# Patient Record
Sex: Male | Born: 1965 | Race: White | Hispanic: No | Marital: Married | Smoking: Never smoker
Health system: Southern US, Community
[De-identification: ages and names within clinical notes are randomized; demographics above are authoritative.]

## PROBLEM LIST (undated history)

## (undated) DIAGNOSIS — K449 Diaphragmatic hernia without obstruction or gangrene: Secondary | ICD-10-CM

## (undated) DIAGNOSIS — N529 Male erectile dysfunction, unspecified: Secondary | ICD-10-CM

## (undated) DIAGNOSIS — K602 Anal fissure, unspecified: Secondary | ICD-10-CM

## (undated) DIAGNOSIS — J189 Pneumonia, unspecified organism: Secondary | ICD-10-CM

## (undated) DIAGNOSIS — K5090 Crohn's disease, unspecified, without complications: Secondary | ICD-10-CM

## (undated) DIAGNOSIS — Z9289 Personal history of other medical treatment: Secondary | ICD-10-CM

## (undated) DIAGNOSIS — R0683 Snoring: Secondary | ICD-10-CM

## (undated) DIAGNOSIS — F419 Anxiety disorder, unspecified: Secondary | ICD-10-CM

## (undated) HISTORY — PX: TONSILLECTOMY: SUR1361

## (undated) HISTORY — PX: ANAL FISSURECTOMY: SUR608

## (undated) HISTORY — DX: Male erectile dysfunction, unspecified: N52.9

## (undated) HISTORY — DX: Crohn's disease, unspecified, without complications: K50.90

## (undated) HISTORY — DX: Snoring: R06.83

## (undated) HISTORY — DX: Anal fissure, unspecified: K60.2

## (undated) HISTORY — DX: Diaphragmatic hernia without obstruction or gangrene: K44.9

## (undated) HISTORY — DX: Anxiety disorder, unspecified: F41.9

## (undated) HISTORY — DX: Pneumonia, unspecified organism: J18.9

---

## 1990-08-18 HISTORY — PX: SMALL INTESTINE SURGERY: SHX150

## 2000-08-18 HISTORY — PX: INCISIONAL HERNIA REPAIR: SHX193

## 2013-10-07 ENCOUNTER — Ambulatory Visit: Payer: Self-pay | Admitting: Medical

## 2014-03-20 ENCOUNTER — Other Ambulatory Visit: Payer: Self-pay | Admitting: Urgent Care

## 2014-03-20 LAB — COMPREHENSIVE METABOLIC PANEL
ANION GAP: 5 — AB (ref 7–16)
AST: 28 U/L (ref 15–37)
Albumin: 3.6 g/dL (ref 3.4–5.0)
Alkaline Phosphatase: 71 U/L
BUN: 14 mg/dL (ref 7–18)
Bilirubin,Total: 0.6 mg/dL (ref 0.2–1.0)
CO2: 28 mmol/L (ref 21–32)
CREATININE: 1.06 mg/dL (ref 0.60–1.30)
Calcium, Total: 8.5 mg/dL (ref 8.5–10.1)
Chloride: 109 mmol/L — ABNORMAL HIGH (ref 98–107)
EGFR (African American): >60
Glucose: 88 mg/dL (ref 65–99)
Osmolality: 283 (ref 275–301)
POTASSIUM: 3.8 mmol/L (ref 3.5–5.1)
SGPT (ALT): 35 U/L
SODIUM: 142 mmol/L (ref 136–145)
Total Protein: 7.4 g/dL (ref 6.4–8.2)

## 2014-03-20 LAB — CBC WITH DIFFERENTIAL/PLATELET
BASOS ABS: 0.1 10*3/uL (ref 0.0–0.1)
BASOS PCT: 0.9 %
EOS ABS: 0.2 10*3/uL (ref 0.0–0.7)
Eosinophil %: 2.2 %
HCT: 41.7 % (ref 40.0–52.0)
HGB: 13.8 g/dL (ref 13.0–18.0)
Lymphocyte #: 2.2 10*3/uL (ref 1.0–3.6)
Lymphocyte %: 30.9 %
MCH: 30.7 pg (ref 26.0–34.0)
MCHC: 33 g/dL (ref 32.0–36.0)
MCV: 93 fL (ref 80–100)
MONO ABS: 0.6 x10 3/mm (ref 0.2–1.0)
MONOS PCT: 8.9 %
NEUTROS ABS: 4.1 10*3/uL (ref 1.4–6.5)
Neutrophil %: 57.1 %
PLATELETS: 191 10*3/uL (ref 150–440)
RBC: 4.48 10*6/uL (ref 4.40–5.90)
RDW: 12.9 % (ref 11.5–14.5)
WBC: 7.2 10*3/uL (ref 3.8–10.6)

## 2014-03-29 ENCOUNTER — Other Ambulatory Visit: Payer: Self-pay | Admitting: Radiology

## 2014-03-29 DIAGNOSIS — N63 Unspecified lump in unspecified breast: Secondary | ICD-10-CM

## 2014-04-07 ENCOUNTER — Ambulatory Visit
Admission: RE | Admit: 2014-04-07 | Discharge: 2014-04-07 | Disposition: A | Discharge status: Home / Self Care | Payer: Federal, State, Local not specified - PPO | Source: Ambulatory Visit | Attending: Radiology | Admitting: Radiology

## 2014-04-07 DIAGNOSIS — N63 Unspecified lump in unspecified breast: Secondary | ICD-10-CM

## 2014-04-07 MED ORDER — GADOBENATE DIMEGLUMINE 529 MG/ML IV SOLN
20.0000 mL | Freq: Once | INTRAVENOUS | Status: AC | PRN
  Administered 2014-04-07: 20 mL via INTRAVENOUS

## 2014-04-13 ENCOUNTER — Other Ambulatory Visit: Payer: Self-pay | Admitting: Surgery

## 2014-04-13 LAB — HCG, QUANTITATIVE, PREGNANCY

## 2014-08-09 ENCOUNTER — Ambulatory Visit: Payer: Self-pay | Admitting: Surgery

## 2014-08-09 LAB — BASIC METABOLIC PANEL
ANION GAP: 5 — AB (ref 7–16)
BUN: 19 mg/dL — ABNORMAL HIGH (ref 7–18)
CALCIUM: 8.4 mg/dL — AB (ref 8.5–10.1)
CREATININE: 1.02 mg/dL (ref 0.60–1.30)
Chloride: 109 mmol/L — ABNORMAL HIGH (ref 98–107)
Co2: 28 mmol/L (ref 21–32)
EGFR (African American): >60
GLUCOSE: 105 mg/dL — AB (ref 65–99)
Osmolality: 286 (ref 275–301)
POTASSIUM: 4.1 mmol/L (ref 3.5–5.1)
Sodium: 142 mmol/L (ref 136–145)

## 2014-08-18 HISTORY — PX: GYNECOMASTIA EXCISION: SHX5170

## 2014-12-09 NOTE — Op Note (Signed)
PATIENT NAME:  Willie Carney, Willie Carney MR#:  [id number] DATE OF BIRTH:  October 25, 1965  DATE OF PROCEDURE:  08/09/2014   PREOPERATIVE DIAGNOSIS: Left breast mass.   POSTOPERATIVE DIAGNOSIS:  Left breast mass, final pathology pending.   PROCEDURE PERFORMED: Left breast mass excision.   SURGEON: Natale Lay, M.D. FACS  ANESTHESIA: General with local 20 mL of 0.25% plain Marcaine.   FINDINGS: Large benign appearing well circumscribed mass in the left retroareolar and left upper outer quadrant.   SPECIMENS: As described above.   ESTIMATED BLOOD LOSS: Minimal.   DESCRIPTION OF PROCEDURE: With informed consent, supine position and general anesthesia with LMA the patient's left breast was sterilely prepped and draped with ChloraPrep solution and a timeout was observed.   An incision at the nipple areolar border was fashioned with scalpel and carried down with electrocautery into the breast tissue. Circumferential breast flaps were raised. Palpation of the area demonstrated a well-circumscribed 4 cm lesion which was excised in its entirety utilizing electrocautery. This extended into the left upper outer quadrant and slightly laterally as well. It was also retroareolar in nature. With the specimen handed off the field, the area was inspected for hemostasis and point hemostasis was obtained. The wound was then reapproximated in the deep layer with several interrupted 3-0 Vicryl sutures. Inverted 3-0 Vicryl deep dermal sutures were placed, followed by running 4-0 Monocryl. Steri-Strips, benzoin, fluffs, and occlusive compressive dressing was placed around the chest. The patient was subsequently extubated and taken to the recovery room in stable and satisfactory condition by anesthesia services.    ____________________________ Redge Gainer Egbert Garibaldi, MD mab:at D: 08/09/2014 08:38:37 ET T: 08/09/2014 11:46:00 ET JOB#: [id number]  cc: Loraine Leriche A. Egbert Garibaldi, MD, <Dictator> Raynald Kemp MD ELECTRONICALLY SIGNED 08/11/2014 19:49

## 2014-12-11 LAB — SURGICAL PATHOLOGY

## 2016-02-08 DIAGNOSIS — K5090 Crohn's disease, unspecified, without complications: Secondary | ICD-10-CM | POA: Diagnosis not present

## 2016-02-08 DIAGNOSIS — Z23 Encounter for immunization: Secondary | ICD-10-CM | POA: Diagnosis not present

## 2016-02-08 DIAGNOSIS — Z1389 Encounter for screening for other disorder: Secondary | ICD-10-CM | POA: Diagnosis not present

## 2016-02-08 DIAGNOSIS — R5383 Other fatigue: Secondary | ICD-10-CM | POA: Diagnosis not present

## 2016-02-08 DIAGNOSIS — R0683 Snoring: Secondary | ICD-10-CM | POA: Diagnosis not present

## 2016-02-08 DIAGNOSIS — K432 Incisional hernia without obstruction or gangrene: Secondary | ICD-10-CM | POA: Diagnosis not present

## 2016-02-29 DIAGNOSIS — K5090 Crohn's disease, unspecified, without complications: Secondary | ICD-10-CM | POA: Diagnosis not present

## 2016-02-29 DIAGNOSIS — K432 Incisional hernia without obstruction or gangrene: Secondary | ICD-10-CM | POA: Diagnosis not present

## 2016-03-08 ENCOUNTER — Other Ambulatory Visit: Payer: Self-pay | Admitting: General Surgery

## 2016-03-08 DIAGNOSIS — K432 Incisional hernia without obstruction or gangrene: Secondary | ICD-10-CM

## 2016-03-14 ENCOUNTER — Ambulatory Visit
Admission: RE | Admit: 2016-03-14 | Discharge: 2016-03-14 | Disposition: A | Discharge status: Home / Self Care | Payer: Federal, State, Local not specified - PPO | Source: Ambulatory Visit | Attending: General Surgery | Admitting: General Surgery

## 2016-03-14 DIAGNOSIS — K432 Incisional hernia without obstruction or gangrene: Secondary | ICD-10-CM | POA: Diagnosis not present

## 2016-03-14 MED ORDER — IOPAMIDOL (ISOVUE-300) INJECTION 61%
100.0000 mL | Freq: Once | INTRAVENOUS | Status: AC | PRN
  Administered 2016-03-14: 100 mL via INTRAVENOUS

## 2016-03-18 ENCOUNTER — Encounter: Payer: Self-pay | Admitting: Gastroenterology

## 2016-04-09 DIAGNOSIS — K080 Exfoliation of teeth due to systemic causes: Secondary | ICD-10-CM | POA: Diagnosis not present

## 2016-05-09 DIAGNOSIS — K080 Exfoliation of teeth due to systemic causes: Secondary | ICD-10-CM | POA: Diagnosis not present

## 2016-05-16 DIAGNOSIS — N62 Hypertrophy of breast: Secondary | ICD-10-CM | POA: Diagnosis not present

## 2016-05-16 DIAGNOSIS — K5090 Crohn's disease, unspecified, without complications: Secondary | ICD-10-CM | POA: Diagnosis not present

## 2016-05-16 DIAGNOSIS — Z125 Encounter for screening for malignant neoplasm of prostate: Secondary | ICD-10-CM | POA: Diagnosis not present

## 2016-05-16 DIAGNOSIS — Z0000 Encounter for general adult medical examination without abnormal findings: Secondary | ICD-10-CM | POA: Diagnosis not present

## 2016-05-23 ENCOUNTER — Encounter: Payer: Self-pay | Admitting: Gastroenterology

## 2016-05-23 ENCOUNTER — Ambulatory Visit (INDEPENDENT_AMBULATORY_CARE_PROVIDER_SITE_OTHER): Payer: Federal, State, Local not specified - PPO | Admitting: Gastroenterology

## 2016-05-23 ENCOUNTER — Encounter (INDEPENDENT_AMBULATORY_CARE_PROVIDER_SITE_OTHER): Payer: Self-pay

## 2016-05-23 VITALS — BP 110/78 | HR 72 | Ht 66.0 in | Wt 212.2 lb

## 2016-05-23 DIAGNOSIS — K5090 Crohn's disease, unspecified, without complications: Secondary | ICD-10-CM | POA: Diagnosis not present

## 2016-05-23 DIAGNOSIS — K439 Ventral hernia without obstruction or gangrene: Secondary | ICD-10-CM | POA: Diagnosis not present

## 2016-05-23 DIAGNOSIS — N528 Other male erectile dysfunction: Secondary | ICD-10-CM | POA: Diagnosis not present

## 2016-05-23 DIAGNOSIS — R197 Diarrhea, unspecified: Secondary | ICD-10-CM | POA: Diagnosis not present

## 2016-05-23 DIAGNOSIS — N62 Hypertrophy of breast: Secondary | ICD-10-CM | POA: Diagnosis not present

## 2016-05-23 DIAGNOSIS — Z1389 Encounter for screening for other disorder: Secondary | ICD-10-CM | POA: Diagnosis not present

## 2016-05-23 DIAGNOSIS — Z0000 Encounter for general adult medical examination without abnormal findings: Secondary | ICD-10-CM | POA: Diagnosis not present

## 2016-05-23 DIAGNOSIS — R5383 Other fatigue: Secondary | ICD-10-CM | POA: Diagnosis not present

## 2016-05-23 MED ORDER — NA SULFATE-K SULFATE-MG SULF 17.5-3.13-1.6 GM/177ML PO SOLN: 1.0000 | Freq: Once | ORAL | 0 refills | Status: AC

## 2016-05-23 NOTE — Patient Instructions (Signed)
If you are age 49 or older, your body mass index should be between 23-30. Your Body mass index is 34.26 kg/m. If this is out of the aforementioned range listed, please consider follow up with your Primary Care Provider.  If you are age 64 or younger, your body mass index should be between 19-25. Your Body mass index is 34.26 kg/m. If this is out of the aformentioned range listed, please consider follow up with your Primary Care Provider.   You have been scheduled for a colonoscopy. Please follow written instructions given to you at your visit today.  Please pick up your prep supplies at the pharmacy within the next 1-3 days. If you use inhalers (even only as needed), please bring them with you on the day of your procedure. Your physician has requested that you go to www.startemmi.com and enter the access code given to you at your visit today. This web site gives a general overview about your procedure. However, you should still follow specific instructions given to you by our office regarding your preparation for the procedure.  Thank you for choosing [facility]  Dr Henry Danis III  

## 2016-05-23 NOTE — Progress Notes (Signed)
[facility] Consult Note:  History: Willie Carney 05/23/2016  Referring physician: Donnajean Lopes, MD  Reason for consult/chief complaint: Hernia (getting ready to have hernia surgery) and Crohn's Disease   Subjective  HPI:  This is a 50 year old man referred by Dr. 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 No outside records are available from this patient's prior surgery, since it was done in 1992. He reports having had part of the right colon removed at the [facility] and was told that he had Crohn's disease. Not recall any further recommendations and he certainly did not follow with a gastroenterologist. Thus, he has never been on any medical therapy for Crohn's disease. He also describes surgery for what was either a fissure or a fistula on the right perianal area at [facility] with the last several years. He described an abdominal wall hernia after his initial surgery, and this was repaired with mesh. He then had recurrence of the hernia and is recently seen in general surgery to consider repair. They would like to know if he has active Crohn's disease before pursuing hernia surgery. He reports decades of bowel habits that alternate between formed to loose or hard stool with occasional need to strain but no rectal bleeding. He denies abdominal pain, his appetite is good and his weight stable.  ROS:  Review of Systems  Constitutional: Negative for appetite change and unexpected weight change.  HENT: Negative for mouth sores and voice change.   Eyes: Negative for pain and redness.  Respiratory: Negative for cough and shortness of breath.   Cardiovascular: Negative for chest pain and palpitations.  Genitourinary: Negative for dysuria and hematuria.  Musculoskeletal: Negative for arthralgias and myalgias.  Skin: Negative for pallor and rash.  Neurological: Negative for weakness and headaches.  Hematological: Negative  for adenopathy.     Past Medical History: Past Medical History:  Diagnosis Date  . Anal fissure   . Anxiety   . Crohn's disease ([place])   . ED (erectile dysfunction)   . Hiatal hernia   . Pneumonia   . Snorings      Past Surgical History: Past Surgical History:  Procedure Laterality Date  . ANAL FISSURECTOMY    . GYNECOMASTIA EXCISION  2016  . [place]  2002  . SMALL INTESTINE SURGERY  1992   took 9 cm     Family History: Family History  Problem Relation Age of Onset  . Lung cancer Mother   . COPD Mother   . Parkinson's disease Father   . Stroke Maternal Grandmother   . Cancer Maternal Uncle     type unknown  . Colon cancer Neg Hx     Social History: Social History   Social History  . Marital status: Married    Spouse name: N/A  . Number of children: 3  . Years of education: N/A   Occupational History  . [employer]    Social History Main Topics  . Smoking status: Never Smoker  . Smokeless tobacco: Never Used  . Alcohol use Yes     Comment: 3-5 per week  . Drug use: No  . Sexual activity: Yes    Partners: Female   Other Topics Concern  . None   Social History Narrative  . None    Allergies: No Known Allergies  Outpatient Meds: Current Outpatient Prescriptions  Medication Sig Dispense Refill  . Cyanocobalamin (VITAMIN B12 PO) Take 1 tablet by mouth daily.    Marland Kitchen  FIBER PO Take 1 tablet by mouth daily.    . Na Sulfate-K Sulfate-Mg Sulf 17.5-3.13-1.6 GM/180ML SOLN Take 1 kit by mouth once. 354 mL 0   No current facility-administered medications for this visit.       ___________________________________________________________________ Objective   Exam:  BP 110/78 (BP Location: Left Arm, Patient Position: Sitting, Cuff Size: Normal)   Pulse 72   Ht 5' 6"  (1.676 m) Comment: height measured without shoes  Wt 212 lb 4 oz (96.3 kg)   BMI 34.26 kg/m    General: this is a(n) well-appearing middle-aged man   Eyes:  sclera anicteric, no redness  ENT: oral mucosa moist without lesions, no cervical or supraclavicular lymphadenopathy, good dentition  CV: RRR without murmur, S1/S2, no JVD, no peripheral edema  Resp: clear to auscultation bilaterally, normal RR and effort noted  GI: soft, no tenderness, with active bowel sounds. No guarding or palpable organomegaly noted. Long midline scar with a spontaneously reducing ventral hernia  Skin; warm and dry, no rash or jaundice noted  Neuro: awake, alert and oriented x 3. Normal gross motor function and fluent speech Rectal exam: Palpable scar tissue at the 9:00 position in the perianal and anal canal, no tenderness, no drainage, no blood, no palpable internal lesions. His prostate is enlarged but soft and with no palpable nodules.  Labs:  Reports recent labs "normal" Radiologic Studies: See 7/28 CTAP report - no clear IBD  Assessment: Encounter Diagnoses  Name Primary?  . Diarrhea, unspecified type Yes  . Ventral hernia without obstruction or gangrene     His history is uncertain, he may very well have had surgery for ileocolonic Crohn's but no records are available. It is also unknown if he had a fissure or a possible IBD related fistula requiring surgery years ago. He does not appear to have an active fistula or fissure right now Plan:  Colonoscopy and further plan to follow.  Thank you for the courtesy of this consult.  Please call me with any questions or concerns.  Nelida Meuse III  CC: Donnajean Lopes, MD Dr. Greer Pickerel, [facility] surgery

## 2016-06-05 ENCOUNTER — Ambulatory Visit: Payer: Self-pay | Admitting: General Surgery

## 2016-06-25 ENCOUNTER — Encounter: Payer: Self-pay | Admitting: Gastroenterology

## 2016-07-04 ENCOUNTER — Encounter: Payer: Self-pay | Admitting: Gastroenterology

## 2016-07-04 ENCOUNTER — Ambulatory Visit (AMBULATORY_SURGERY_CENTER): Payer: Federal, State, Local not specified - PPO | Admitting: Gastroenterology

## 2016-07-04 VITALS — BP 113/78 | HR 76 | Temp 98.9°F | Resp 12 | Ht 66.0 in | Wt 212.0 lb

## 2016-07-04 DIAGNOSIS — R197 Diarrhea, unspecified: Secondary | ICD-10-CM | POA: Diagnosis not present

## 2016-07-04 MED ORDER — SODIUM CHLORIDE 0.9 % IV SOLN: 500.0000 mL | INTRAVENOUS | Status: AC

## 2016-07-04 NOTE — Progress Notes (Signed)
To recovery VSS report to RN 

## 2016-07-04 NOTE — Patient Instructions (Signed)
Impression/Recommendations:  Repeat colonoscopy in 10 years for screening purposes.  YOU HAD AN ENDOSCOPIC PROCEDURE TODAY AT THE [facility]:   Refer to the procedure report that was given to you for any specific questions about what was found during the examination.  If the procedure report does not answer your questions, please call your gastroenterologist to clarify.  If you requested that your care partner not be given the details of your procedure findings, then the procedure report has been included in a sealed envelope for you to review at your convenience later.  YOU SHOULD EXPECT: Some feelings of bloating in the abdomen. Passage of more gas than usual.  Walking can help get rid of the air that was put into your GI tract during the procedure and reduce the bloating. If you had a lower endoscopy (such as a colonoscopy or flexible sigmoidoscopy) you may notice spotting of blood in your stool or on the toilet paper. If you underwent a bowel prep for your procedure, you may not have a normal bowel movement for a few days.  Please Note:  You might notice some irritation and congestion in your nose or some drainage.  This is from the oxygen used during your procedure.  There is no need for concern and it should clear up in a day or so.  SYMPTOMS TO REPORT IMMEDIATELY:   Following lower endoscopy (colonoscopy or flexible sigmoidoscopy):  Excessive amounts of blood in the stool  Significant tenderness or worsening of abdominal pains  Swelling of the abdomen that is new, acute  Fever of 100F or higher For urgent or emergent issues, a gastroenterologist can be reached at any hour by calling [phone].   DIET:  We do recommend a small meal at first, but then you may proceed to your regular diet.  Drink plenty of fluids but you should avoid alcoholic beverages for 24 hours.  ACTIVITY:  You should plan to take it easy for the rest of today and you should NOT DRIVE or use heavy  machinery until tomorrow (because of the sedation medicines used during the test).    FOLLOW UP: Our staff will call the number listed on your records the next business day following your procedure to check on you and address any questions or concerns that you may have regarding the information given to you following your procedure. If we do not reach you, we will leave a message.  However, if you are feeling well and you are not experiencing any problems, there is no need to return our call.  We will assume that you have returned to your regular daily activities without incident.  If any biopsies were taken you will be contacted by phone or by letter within the next 1-3 weeks.  Please call us at [phone] if you have not heard about the biopsies in 3 weeks.    SIGNATURES/CONFIDENTIALITY: You and/or your care partner have signed paperwork which will be entered into your electronic medical record.  These signatures attest to the fact that that the information above on your After Visit Summary has been reviewed and is understood.  Full responsibility of the confidentiality of this discharge information lies with you and/or your care-partner.

## 2016-07-04 NOTE — Op Note (Signed)
[facility] Patient Name: Willie Carney Procedure Date: 07/04/2016 2:17 PM MRN: [id number] Endoscopist: Sherilyn Cooter L. Myrtie Neither , MD Age: 50 Referring MD:  Date of Birth: 1966/08/03 Gender: Male Account #: [id number] Procedure:                Colonoscopy Indications:              Chronic diarrhea, prior partial colectomy for                            reported Crohn's disease (no prior records for                            review). Medicines:                Monitored Anesthesia Care Procedure:                Pre-Anesthesia Assessment:                           - Prior to the procedure, a History and Physical                            was performed, and patient medications and                            allergies were reviewed. The patient's tolerance of                            previous anesthesia was also reviewed. The risks                            and benefits of the procedure and the sedation                            options and risks were discussed with the patient.                            All questions were answered, and informed consent                            was obtained. Prior Anticoagulants: The patient has                            taken no previous anticoagulant or antiplatelet                            agents. ASA Grade Assessment: II - A patient with                            mild systemic disease. After reviewing the risks                            and benefits, the patient was deemed in  satisfactory condition to undergo the procedure.                           After obtaining informed consent, the colonoscope                            was passed under direct vision. Throughout the                            procedure, the patient's blood pressure, pulse, and                            oxygen saturations were monitored continuously. The                            Model PCF-H190L [id number](SN#[id number]) scope was introduced                  through the anus and advanced to the the                            neo-terminal ileum. The colonoscopy was performed                            without difficulty. The patient tolerated the                            procedure well. The quality of the bowel                            preparation was excellent. The terminal ileum and                            the rectum were photographed. The bowel preparation                            used was SUPREP. Scope In: 2:23:01 PM Scope Out: 2:36:26 PM Scope Withdrawal Time: 0 hours 8 minutes 25 seconds  Total Procedure Duration: 0 hours 13 minutes 25 seconds  Findings:                 The perianal and digital rectal examinations were                            normal.                           The neo-terminal ileum appeared normal. No active                            Crohn's disease was seen.                           There was evidence of a prior end-to-side                            ileo-colonic  anastomosis in the mid ascending                            colon. This was patent and was characterized by                            healthy appearing mucosa. The entire colonic mucosa                            was normal.                           The exam was otherwise without abnormality on                            direct and retroflexion views. Complications:            No immediate complications. Estimated Blood Loss:     Estimated blood loss: none. Impression:               - The examined portion of the ileum was normal.                           - Patent end-to-side ileo-colonic anastomosis,                            characterized by healthy appearing mucosa.                           - The examination was otherwise normal on direct                            and retroflexion views.                           - No specimens collected. Recommendation:           - Patient has a contact number available for                             emergencies. The signs and symptoms of potential                            delayed complications were discussed with the                            patient. Return to normal activities tomorrow.                            Written discharge instructions were provided to the                            patient.                           - Resume previous diet.                           -  Continue present medications.                           - Repeat colonoscopy in 10 years for screening                            purposes. Daymon Hora L. Myrtie Neither, MD 07/04/2016 2:44:05 PM This report has been signed electronically. CC Letter to:             Gaynelle Adu, MD

## 2016-07-07 ENCOUNTER — Telehealth: Payer: Self-pay

## 2016-07-07 ENCOUNTER — Telehealth: Payer: Self-pay | Admitting: *Deleted

## 2016-07-07 NOTE — Telephone Encounter (Signed)
  Follow up Call-  Call back number 07/04/2016  Post procedure Call Back phone  # [phone]  Permission to leave phone message Yes  Some recent data might be hidden     Patient questions:  Do you have a fever, pain , or abdominal swelling? No. Pain Score  0 *  Have you tolerated food without any problems? Yes.    Have you been able to return to your normal activities? Yes.    Do you have any questions about your discharge instructions: Diet   No. Medications  No. Follow up visit  No.  Do you have questions or concerns about your Care? No.  Actions: * If pain score is 4 or above: No action needed, pain <4.

## 2016-07-07 NOTE — Telephone Encounter (Signed)
No answer. Name identifier. Message left we will attempt to call later today.

## 2016-07-31 DIAGNOSIS — K432 Incisional hernia without obstruction or gangrene: Secondary | ICD-10-CM | POA: Diagnosis not present

## 2016-08-05 ENCOUNTER — Encounter (INDEPENDENT_AMBULATORY_CARE_PROVIDER_SITE_OTHER): Payer: Self-pay

## 2016-08-05 ENCOUNTER — Encounter (HOSPITAL_COMMUNITY)
Admission: RE | Admit: 2016-08-05 | Discharge: 2016-08-05 | Disposition: A | Discharge status: Home / Self Care | Payer: Federal, State, Local not specified - PPO | Source: Ambulatory Visit | Attending: General Surgery | Admitting: General Surgery

## 2016-08-05 ENCOUNTER — Encounter (HOSPITAL_COMMUNITY): Payer: Self-pay

## 2016-08-05 DIAGNOSIS — F329 Major depressive disorder, single episode, unspecified: Secondary | ICD-10-CM | POA: Diagnosis not present

## 2016-08-05 DIAGNOSIS — K5090 Crohn's disease, unspecified, without complications: Secondary | ICD-10-CM | POA: Diagnosis not present

## 2016-08-05 DIAGNOSIS — F419 Anxiety disorder, unspecified: Secondary | ICD-10-CM | POA: Diagnosis not present

## 2016-08-05 DIAGNOSIS — K432 Incisional hernia without obstruction or gangrene: Secondary | ICD-10-CM | POA: Diagnosis not present

## 2016-08-05 DIAGNOSIS — Z01812 Encounter for preprocedural laboratory examination: Secondary | ICD-10-CM | POA: Insufficient documentation

## 2016-08-05 DIAGNOSIS — Z888 Allergy status to other drugs, medicaments and biological substances status: Secondary | ICD-10-CM | POA: Diagnosis not present

## 2016-08-05 HISTORY — DX: Personal history of other medical treatment: Z92.89

## 2016-08-05 LAB — COMPREHENSIVE METABOLIC PANEL
ALK PHOS: 46 U/L (ref 38–126)
ALT: 24 U/L (ref 17–63)
ANION GAP: 6 (ref 5–15)
AST: 24 U/L (ref 15–41)
Albumin: 4 g/dL (ref 3.5–5.0)
BILIRUBIN TOTAL: 0.9 mg/dL (ref 0.3–1.2)
BUN: 15 mg/dL (ref 6–20)
CALCIUM: 8.6 mg/dL — AB (ref 8.9–10.3)
CO2: 25 mmol/L (ref 22–32)
CREATININE: 0.86 mg/dL (ref 0.61–1.24)
Chloride: 108 mmol/L (ref 101–111)
GFR calc non Af Amer: >60 mL/min (ref >60)
GLUCOSE: 91 mg/dL (ref 65–99)
Potassium: 3.9 mmol/L (ref 3.5–5.1)
SODIUM: 139 mmol/L (ref 135–145)
TOTAL PROTEIN: 6.9 g/dL (ref 6.5–8.1)

## 2016-08-05 LAB — CBC WITH DIFFERENTIAL/PLATELET
Basophils Absolute: 0 10*3/uL (ref 0.0–0.1)
Basophils Relative: 0 %
EOS ABS: 0.3 10*3/uL (ref 0.0–0.7)
Eosinophils Relative: 5 %
HEMATOCRIT: 39.2 % (ref 39.0–52.0)
HEMOGLOBIN: 13.8 g/dL (ref 13.0–17.0)
LYMPHS ABS: 2.1 10*3/uL (ref 0.7–4.0)
LYMPHS PCT: 38 %
MCH: 31.2 pg (ref 26.0–34.0)
MCHC: 35.2 g/dL (ref 30.0–36.0)
MCV: 88.5 fL (ref 78.0–100.0)
MONOS PCT: 9 %
Monocytes Absolute: 0.5 10*3/uL (ref 0.1–1.0)
NEUTROS ABS: 2.7 10*3/uL (ref 1.7–7.7)
NEUTROS PCT: 48 %
Platelets: 167 10*3/uL (ref 150–400)
RBC: 4.43 MIL/uL (ref 4.22–5.81)
RDW: 11.9 % (ref 11.5–15.5)
WBC: 5.6 10*3/uL (ref 4.0–10.5)

## 2016-08-05 NOTE — Patient Instructions (Addendum)
Willie Carney  08/05/2016   Your procedure is scheduled on: 08-07-16  Report to [facility] Main  Entrance take [facility]  elevators to 3rd floor to  Short Stay Center at  0530  AM.  Call this number if you have problems the morning of surgery [phone]   Remember: ONLY 1 PERSON MAY GO WITH YOU TO SHORT STAY TO GET  READY MORNING OF YOUR SURGERY.  Do not eat food or drink liquids :After Midnight.     Take these medicines the morning of surgery with A SIP OF WATER: Eye drops-usual. DO NOT TAKE ANY DIABETIC MEDICATIONS DAY OF YOUR SURGERY                               You may not have any metal on your body including hair pins and              piercings  Do not wear jewelry, make-up, lotions, powders or perfumes, deodorant             Do not wear nail polish.  Do not shave  48 hours prior to surgery.              Men may shave face and neck.   Do not bring valuables to the hospital. [facility] IS NOT             RESPONSIBLE   FOR VALUABLES.  Contacts, dentures or bridgework may not be worn into surgery.  Leave suitcase in the car. After surgery it may be brought to your room.     Patients discharged the day of surgery will not be allowed to drive home.  Name and phone number of your driver:Willie Carney -spouse  Special Instructions: N/A              Please read over the following fact sheets you were given: _____________________________________________________________________             [facility] - Preparing for Surgery Before surgery, you can play an important role.  Because skin is not sterile, your skin needs to be as free of germs as possible.  You can reduce the number of germs on your skin by washing with CHG (chlorahexidine gluconate) soap before surgery.  CHG is an antiseptic cleaner which kills germs and bonds with the skin to continue killing germs even after washing. Please DO NOT use if you have an allergy to CHG or antibacterial soaps.  If your  skin becomes reddened/irritated stop using the CHG and inform your nurse when you arrive at Short Stay. Do not shave (including legs and underarms) for at least 48 hours prior to the first CHG shower.  You may shave your face/neck. Please follow these instructions carefully:  1.  Shower with CHG Soap the night before surgery and the  morning of Surgery.  2.  If you choose to wash your hair, wash your hair first as usual with your  normal  shampoo.  3.  After you shampoo, rinse your hair and body thoroughly to remove the  shampoo.                           4.  Use CHG as you would any other liquid soap.  You can apply chg directly  to the  skin and wash                       Gently with a scrungie or clean washcloth.  5.  Apply the CHG Soap to your body ONLY FROM THE NECK DOWN.   Do not use on face/ open                           Wound or open sores. Avoid contact with eyes, ears mouth and genitals (private parts).                       Wash face,  Genitals (private parts) with your normal soap.             6.  Wash thoroughly, paying special attention to the area where your surgery  will be performed.  7.  Thoroughly rinse your body with warm water from the neck down.  8.  DO NOT shower/wash with your normal soap after using and rinsing off  the CHG Soap.                9.  Pat yourself dry with a clean towel.            10.  Wear clean pajamas.            11.  Place clean sheets on your bed the night of your first shower and do not  sleep with pets. Day of Surgery : Do not apply any lotions/deodorants the morning of surgery.  Please wear clean clothes to the hospital/surgery center.  FAILURE TO FOLLOW THESE INSTRUCTIONS MAY RESULT IN THE CANCELLATION OF YOUR SURGERY PATIENT SIGNATURE_________________________________  NURSE SIGNATURE__________________________________  ________________________________________________________________________

## 2016-08-06 NOTE — H&P (Signed)
Willie Carney 07/31/2016 1:35 PM Location: [facility] Patient #: [id number] DOB: 1965/11/18 Married / Language: Willie Carney / Race: White Male  History of Present Illness Willie Carney M. Willie Froh MD; 08/06/2016 9:08 AM) The patient is a 50 year old male who presents with an incisional hernia. He comes in today for preoperative appointment. I initially met him in the summer for an incisional hernia. He denies any significant medical changes since he was last seen. However he states he did undergo a colonoscopy and there is no evidence of inflammatory bowel disease per the gastroenterologist. He denies any nausea, vomiting, diarrhea or constipation. He reports good energy level. He denies any chest pain, chest pressure, shortness of breath, dyspnea on exertion. He underwent a CT scan since his last visit and we discussed the results on the phone a few months ago. He has 2 incisional hernias. The more superior one is about 1 1/2 cm wide and underwent a bit more inferior which is in the supraumbilical position is 4.97 cm wide. There is a small piece of intact fascia between the 2. The total height when combining both defects was around 8.7 cm. It only contained fat.  02/2016 He is referred by Dr Philip Aspen for evaluation of a incisional hernia. It appears he had a ileocectomy in 1992 for what sounds like Crohns disease. He developed an incisional hernia and underwent repair in 2000 at the [facility]. It is unclear whether or not mesh was used. 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 It is now bothering him more but denies it has never been hard/firm. reducible. he has been having bouts of nausea and vomiting. has a BM daily. also reports what sounds like a h/o anal fissure and fistula that was treated at [facility]. not on any medication for Crohns. denies blood diarrhea. denies weight loss, cp, sob, doe.  ROS - 12 point ROS is negative except for what is mentioned in HPI   Problem  List/Past Medical Willie Carney M. Redmond Pulling, MD; 08/06/2016 9:10 AM) Willie Carney HERNIA, WITHOUT OBSTRUCTION OR GANGRENE (K43.2)  Past Surgical History Willie Carney M. Redmond Pulling, MD; 08/06/2016 9:10 AM) Anal Fissure Repair Breast Biopsy Left. Colon Removal - Partial Resection of Small Bowel Tonsillectomy  Diagnostic Studies History Willie Carney M. Redmond Pulling, MD; 08/06/2016 9:10 AM) Colonoscopy >10 years ago  Allergies Bary Castilla [place], [place]; 07/31/2016 1:35 PM) No Known Drug Allergies 02/29/2016  Medication History Nance Pear, [place]; 07/31/2016 1:36 PM) Delma Freeze Allergy (60MG Tablet, Oral) Active. B12-Active (1MG Tablet Chewable, Oral) Active. Fiber Complete (Oral) Active. Multiple Vitamin (Oral) Active. Aleve (220MG Capsule, Oral) Active. Medications Reconciled  Social History Willie Carney M. Redmond Pulling, MD; 08/06/2016 9:10 AM) Alcohol use Moderate alcohol use. Caffeine use Carbonated beverages, Coffee, Tea. No drug use Tobacco use Never smoker.  Family History Willie Carney M. Redmond Pulling, MD; 08/06/2016 9:10 AM) Respiratory Condition Mother.  Other Problems Willie Carney M. 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 Alexsia Klindt MD; 08/06/2016 9:06 AM)  General Mental Status-Alert. General Appearance-Consistent with stated age. Hydration-Well hydrated. Voice-Normal. Note: obese  Head and Neck Head-normocephalic, atraumatic with no lesions or palpable masses. Trachea-midline. Thyroid Gland Characteristics - normal  size and consistency.  Eye Eyeball - Bilateral-Extraocular movements  intact. Sclera/Conjunctiva - Bilateral-No scleral icterus.  Chest and Lung Exam Chest and lung exam reveals -quiet, even and easy respiratory effort with no use of accessory muscles and on auscultation, normal breath sounds, no adventitious sounds and normal vocal resonance. Inspection Chest Wall - Normal. Back - normal.  Breast - Did not examine.  Cardiovascular Cardiovascular examination reveals -normal heart sounds, regular rate and rhythm with no murmurs and normal pedal pulses bilaterally.  Abdomen Inspection Inspection of the abdomen reveals - No Hernias. Skin - Scar - Note: midline incision starting several inches above umbilicus extending to pubis. has what feels like 2cm fascial defect just above umbilicus. soft, nt, reducible. no other palpable defect. Palpation/Percussion Palpation and Percussion of the abdomen reveal - Soft, Non Tender, No Rebound tenderness, No Rigidity (guarding) and No hepatosplenomegaly. Auscultation Auscultation of the abdomen reveals - Bowel sounds normal.  Peripheral Vascular Upper Extremity Palpation - Pulses bilaterally normal.  Neurologic Neurologic evaluation reveals -alert and oriented x 3 with no impairment of recent or remote memory. Mental Status-Normal.  Neuropsychiatric The patient's mood and affect are described as -normal. Judgment and Insight-insight is appropriate concerning matters relevant to self.  Musculoskeletal Normal Exam - Left-Upper Extremity Strength Normal and Lower Extremity Strength Normal. Normal Exam - Right-Upper Extremity Strength Normal and Lower Extremity Strength Normal.  Lymphatic Head & Neck  General Head & Neck Lymphatics: Bilateral - Description - Normal. Axillary - Did not examine. Femoral & Inguinal - Did not examine.    Assessment & Plan Willie Carney M. Karrina Lye MD; 08/06/2016 9:10 AM)  Willie Carney HERNIA, WITHOUT OBSTRUCTION OR GANGRENE (K43.2) Impression: He has recurrent incisional  hernia. it is still soft, reducible. He is scheduled for open repair of incisional hernia next week.  With respect to operative management, we rediscussed both open repair and laparoscopic repair. We discussed the pros and cons of each approach. I discussed the typical aftercare with each procedure and how each procedure differs. The patient has decided to go with an open approach (retro-rectus approach with mesh)  We discussed the risk and benefits of surgery including but not limited to bleeding, infection, injury to surrounding structures, hernia recurrence, mesh complications, hematoma/seroma formation, blood clot formation, urinary retention, post operative ileus, general anesthesia risk, long-term abdominal pain. We discussed that this procedure can be quite uncomfortable and difficult to recover from based on how the mesh is secured to the abdominal wall. We discussed the importance of avoiding heavy lifting and straining for a period of 6 weeks. I also informed him I was going to be on vacation starting the day after his surgery and that one of my partners would round on him.   Current Plans Pt Education - Pamphlet Given - Hernia Surgery: discussed with patient and provided information.  Leighton Ruff. Redmond Pulling, MD, FACS General, Bariatric, & Minimally Invasive Surgery [facility], [place]

## 2016-08-07 ENCOUNTER — Encounter (HOSPITAL_COMMUNITY)
Admission: RE | Disposition: A | Discharge status: Home / Self Care | Payer: Self-pay | Source: Ambulatory Visit | Attending: General Surgery

## 2016-08-07 ENCOUNTER — Ambulatory Visit (HOSPITAL_COMMUNITY): Payer: Federal, State, Local not specified - PPO | Admitting: Certified Registered"

## 2016-08-07 ENCOUNTER — Observation Stay (HOSPITAL_COMMUNITY)
Admission: RE | Admit: 2016-08-07 | Discharge: 2016-08-08 | Disposition: A | Discharge status: Home / Self Care | Payer: Federal, State, Local not specified - PPO | Source: Ambulatory Visit | Attending: General Surgery | Admitting: General Surgery

## 2016-08-07 ENCOUNTER — Encounter (HOSPITAL_COMMUNITY): Payer: Self-pay | Admitting: *Deleted

## 2016-08-07 DIAGNOSIS — F329 Major depressive disorder, single episode, unspecified: Secondary | ICD-10-CM | POA: Diagnosis not present

## 2016-08-07 DIAGNOSIS — Z888 Allergy status to other drugs, medicaments and biological substances status: Secondary | ICD-10-CM | POA: Insufficient documentation

## 2016-08-07 DIAGNOSIS — K432 Incisional hernia without obstruction or gangrene: Secondary | ICD-10-CM | POA: Diagnosis not present

## 2016-08-07 DIAGNOSIS — F419 Anxiety disorder, unspecified: Secondary | ICD-10-CM | POA: Diagnosis not present

## 2016-08-07 DIAGNOSIS — K5090 Crohn's disease, unspecified, without complications: Secondary | ICD-10-CM | POA: Insufficient documentation

## 2016-08-07 HISTORY — PX: INCISIONAL HERNIA REPAIR: SHX193

## 2016-08-07 HISTORY — PX: INSERTION OF MESH: SHX5868

## 2016-08-07 SURGERY — REPAIR, HERNIA, INCISIONAL
Anesthesia: General | Site: Abdomen

## 2016-08-07 MED ORDER — ZOLPIDEM TARTRATE 5 MG PO TABS: 5.0000 mg | ORAL_TABLET | Freq: Every evening | ORAL | Status: DC | PRN

## 2016-08-07 MED ORDER — GABAPENTIN 300 MG PO CAPS
300.0000 mg | ORAL_CAPSULE | ORAL | Status: AC
  Administered 2016-08-07: 300 mg via ORAL
  Filled 2016-08-07: qty 1

## 2016-08-07 MED ORDER — PROPOFOL 10 MG/ML IV BOLUS
INTRAVENOUS | Status: AC
  Filled 2016-08-07: qty 20

## 2016-08-07 MED ORDER — ACETAMINOPHEN 10 MG/ML IV SOLN
INTRAVENOUS | Status: AC
  Filled 2016-08-07: qty 100

## 2016-08-07 MED ORDER — GABAPENTIN 300 MG PO CAPS
300.0000 mg | ORAL_CAPSULE | Freq: Three times a day (TID) | ORAL | Status: DC
  Administered 2016-08-07 – 2016-08-08 (×3): 300 mg via ORAL
  Filled 2016-08-07 (×3): qty 1

## 2016-08-07 MED ORDER — HYDROMORPHONE HCL 1 MG/ML IJ SOLN
0.2500 mg | INTRAMUSCULAR | Status: DC | PRN
  Administered 2016-08-07 (×4): 0.5 mg via INTRAVENOUS

## 2016-08-07 MED ORDER — METHOCARBAMOL 500 MG PO TABS
500.0000 mg | ORAL_TABLET | Freq: Four times a day (QID) | ORAL | Status: DC | PRN
  Administered 2016-08-08: 500 mg via ORAL
  Filled 2016-08-07: qty 1

## 2016-08-07 MED ORDER — LACTATED RINGERS IV SOLN: INTRAVENOUS | Status: DC

## 2016-08-07 MED ORDER — SODIUM CHLORIDE 0.9 % IR SOLN
Status: DC | PRN
  Administered 2016-08-07: 500 mL

## 2016-08-07 MED ORDER — KCL IN DEXTROSE-NACL 20-5-0.45 MEQ/L-%-% IV SOLN
INTRAVENOUS | Status: DC
  Administered 2016-08-07 – 2016-08-08 (×2): 50 mL/h via INTRAVENOUS
  Filled 2016-08-07 (×2): qty 1000

## 2016-08-07 MED ORDER — ONDANSETRON HCL 4 MG/2ML IJ SOLN: 4.0000 mg | Freq: Four times a day (QID) | INTRAMUSCULAR | Status: DC | PRN

## 2016-08-07 MED ORDER — ACETAMINOPHEN 500 MG PO TABS
1000.0000 mg | ORAL_TABLET | Freq: Four times a day (QID) | ORAL | Status: DC
  Administered 2016-08-07 – 2016-08-08 (×3): 1000 mg via ORAL
  Filled 2016-08-07 (×3): qty 2

## 2016-08-07 MED ORDER — DEXAMETHASONE SODIUM PHOSPHATE 10 MG/ML IJ SOLN
INTRAMUSCULAR | Status: DC | PRN
  Administered 2016-08-07: 8 mg via INTRAVENOUS

## 2016-08-07 MED ORDER — FENTANYL CITRATE (PF) 100 MCG/2ML IJ SOLN
INTRAMUSCULAR | Status: DC | PRN
  Administered 2016-08-07: 100 ug via INTRAVENOUS

## 2016-08-07 MED ORDER — DIPHENHYDRAMINE HCL 12.5 MG/5ML PO ELIX: 12.5000 mg | ORAL_SOLUTION | Freq: Four times a day (QID) | ORAL | Status: DC | PRN

## 2016-08-07 MED ORDER — NAPHAZOLINE-GLYCERIN 0.012-0.2 % OP SOLN
1.0000 [drp] | Freq: Four times a day (QID) | OPHTHALMIC | Status: DC | PRN
  Filled 2016-08-07: qty 15

## 2016-08-07 MED ORDER — LACTATED RINGERS IV SOLN
INTRAVENOUS | Status: DC | PRN
  Administered 2016-08-07 (×2): via INTRAVENOUS

## 2016-08-07 MED ORDER — PROMETHAZINE HCL 25 MG/ML IJ SOLN: 6.2500 mg | INTRAMUSCULAR | Status: DC | PRN

## 2016-08-07 MED ORDER — 0.9 % SODIUM CHLORIDE (POUR BTL) OPTIME
TOPICAL | Status: DC | PRN
  Administered 2016-08-07: 1000 mL

## 2016-08-07 MED ORDER — HYDROMORPHONE HCL 1 MG/ML IJ SOLN
INTRAMUSCULAR | Status: AC
  Filled 2016-08-07: qty 1

## 2016-08-07 MED ORDER — HEPARIN SODIUM (PORCINE) 5000 UNIT/ML IJ SOLN
5000.0000 [IU] | Freq: Three times a day (TID) | INTRAMUSCULAR | Status: DC
  Administered 2016-08-07 – 2016-08-08 (×3): 5000 [IU] via SUBCUTANEOUS
  Filled 2016-08-07 (×3): qty 1

## 2016-08-07 MED ORDER — PROPOFOL 10 MG/ML IV BOLUS
INTRAVENOUS | Status: DC | PRN
Start: 2016-08-07 — End: 2016-08-07
  Administered 2016-08-07: 200 mg via INTRAVENOUS

## 2016-08-07 MED ORDER — ACETAMINOPHEN 500 MG PO TABS
1000.0000 mg | ORAL_TABLET | ORAL | Status: AC
  Administered 2016-08-07: 1000 mg via ORAL
  Filled 2016-08-07: qty 2

## 2016-08-07 MED ORDER — ROCURONIUM BROMIDE 50 MG/5ML IV SOSY
PREFILLED_SYRINGE | INTRAVENOUS | Status: AC
  Filled 2016-08-07: qty 5

## 2016-08-07 MED ORDER — SODIUM CHLORIDE 0.9 % IJ SOLN
INTRAMUSCULAR | Status: AC
  Filled 2016-08-07: qty 50

## 2016-08-07 MED ORDER — KETAMINE HCL 10 MG/ML IJ SOLN
INTRAMUSCULAR | Status: AC
  Filled 2016-08-07: qty 1

## 2016-08-07 MED ORDER — PANTOPRAZOLE SODIUM 40 MG IV SOLR
40.0000 mg | Freq: Every day | INTRAVENOUS | Status: DC
  Administered 2016-08-07: 40 mg via INTRAVENOUS
  Filled 2016-08-07: qty 40

## 2016-08-07 MED ORDER — KETOROLAC TROMETHAMINE 30 MG/ML IJ SOLN: 30.0000 mg | Freq: Once | INTRAMUSCULAR | Status: DC | PRN

## 2016-08-07 MED ORDER — CHLORHEXIDINE GLUCONATE CLOTH 2 % EX PADS: 6.0000 | MEDICATED_PAD | Freq: Once | CUTANEOUS | Status: DC

## 2016-08-07 MED ORDER — ONDANSETRON HCL 4 MG/2ML IJ SOLN
INTRAMUSCULAR | Status: AC
  Filled 2016-08-07: qty 2

## 2016-08-07 MED ORDER — MIDAZOLAM HCL 2 MG/2ML IJ SOLN
INTRAMUSCULAR | Status: AC
  Filled 2016-08-07: qty 2

## 2016-08-07 MED ORDER — SODIUM CHLORIDE 0.9 % IJ SOLN
INTRAMUSCULAR | Status: DC | PRN
  Administered 2016-08-07: 30 mL

## 2016-08-07 MED ORDER — SIMETHICONE 80 MG PO CHEW: 40.0000 mg | CHEWABLE_TABLET | Freq: Four times a day (QID) | ORAL | Status: DC | PRN

## 2016-08-07 MED ORDER — LIDOCAINE 2% (20 MG/ML) 5 ML SYRINGE
INTRAMUSCULAR | Status: AC
  Filled 2016-08-07: qty 5

## 2016-08-07 MED ORDER — BUPIVACAINE LIPOSOME 1.3 % IJ SUSP
20.0000 mL | Freq: Once | INTRAMUSCULAR | Status: AC
  Administered 2016-08-07: 20 mL
  Filled 2016-08-07: qty 20

## 2016-08-07 MED ORDER — ROCURONIUM BROMIDE 10 MG/ML (PF) SYRINGE
PREFILLED_SYRINGE | INTRAVENOUS | Status: DC | PRN
  Administered 2016-08-07: 20 mg via INTRAVENOUS
  Administered 2016-08-07: 10 mg via INTRAVENOUS
  Administered 2016-08-07: 50 mg via INTRAVENOUS
  Administered 2016-08-07: 10 mg via INTRAVENOUS

## 2016-08-07 MED ORDER — DEXAMETHASONE SODIUM PHOSPHATE 10 MG/ML IJ SOLN
INTRAMUSCULAR | Status: AC
  Filled 2016-08-07: qty 1

## 2016-08-07 MED ORDER — CEFAZOLIN SODIUM-DEXTROSE 2-4 GM/100ML-% IV SOLN
INTRAVENOUS | Status: AC
  Filled 2016-08-07: qty 100

## 2016-08-07 MED ORDER — CELECOXIB 200 MG PO CAPS
400.0000 mg | ORAL_CAPSULE | ORAL | Status: AC
  Administered 2016-08-07: 400 mg via ORAL
  Filled 2016-08-07: qty 2

## 2016-08-07 MED ORDER — POLYMYXIN B SULFATE 500000 UNITS IJ SOLR
INTRAMUSCULAR | Status: AC
  Filled 2016-08-07: qty 500000

## 2016-08-07 MED ORDER — OXYCODONE HCL 5 MG/5ML PO SOLN: 5.0000 mg | Freq: Once | ORAL | Status: DC | PRN

## 2016-08-07 MED ORDER — BUPIVACAINE HCL (PF) 0.5 % IJ SOLN
INTRAMUSCULAR | Status: AC
  Filled 2016-08-07: qty 30

## 2016-08-07 MED ORDER — OXYCODONE HCL 5 MG PO TABS: 5.0000 mg | ORAL_TABLET | Freq: Once | ORAL | Status: DC | PRN

## 2016-08-07 MED ORDER — ONDANSETRON 4 MG PO TBDP: 4.0000 mg | ORAL_TABLET | Freq: Four times a day (QID) | ORAL | Status: DC | PRN

## 2016-08-07 MED ORDER — LIDOCAINE 2% (20 MG/ML) 5 ML SYRINGE
INTRAMUSCULAR | Status: DC | PRN
  Administered 2016-08-07: 100 mg via INTRAVENOUS

## 2016-08-07 MED ORDER — FENTANYL CITRATE (PF) 100 MCG/2ML IJ SOLN
INTRAMUSCULAR | Status: AC
  Filled 2016-08-07: qty 2

## 2016-08-07 MED ORDER — SUGAMMADEX SODIUM 200 MG/2ML IV SOLN
INTRAVENOUS | Status: DC | PRN
  Administered 2016-08-07: 200 mg via INTRAVENOUS

## 2016-08-07 MED ORDER — DIPHENHYDRAMINE HCL 50 MG/ML IJ SOLN: 12.5000 mg | Freq: Four times a day (QID) | INTRAMUSCULAR | Status: DC | PRN

## 2016-08-07 MED ORDER — OXYCODONE HCL 5 MG PO TABS
5.0000 mg | ORAL_TABLET | ORAL | Status: DC | PRN
  Administered 2016-08-07 (×2): 5 mg via ORAL
  Administered 2016-08-08 (×3): 10 mg via ORAL
  Filled 2016-08-07 (×2): qty 1
  Filled 2016-08-07 (×3): qty 2

## 2016-08-07 MED ORDER — MORPHINE SULFATE (PF) 2 MG/ML IV SOLN
1.0000 mg | INTRAVENOUS | Status: DC | PRN
  Administered 2016-08-07: 2 mg via INTRAVENOUS
  Filled 2016-08-07: qty 1

## 2016-08-07 MED ORDER — ONDANSETRON HCL 4 MG/2ML IJ SOLN
INTRAMUSCULAR | Status: DC | PRN
  Administered 2016-08-07: 4 mg via INTRAVENOUS

## 2016-08-07 MED ORDER — KETAMINE HCL 10 MG/ML IJ SOLN
INTRAMUSCULAR | Status: DC | PRN
  Administered 2016-08-07: 10 mg via INTRAVENOUS
  Administered 2016-08-07: 20 mg via INTRAVENOUS
  Administered 2016-08-07: 10 mg via INTRAVENOUS
  Administered 2016-08-07: 20 mg via INTRAVENOUS
  Administered 2016-08-07: 10 mg via INTRAVENOUS

## 2016-08-07 MED ORDER — MIDAZOLAM HCL 5 MG/5ML IJ SOLN
INTRAMUSCULAR | Status: DC | PRN
  Administered 2016-08-07: 2 mg via INTRAVENOUS

## 2016-08-07 MED ORDER — DOCUSATE SODIUM 100 MG PO CAPS
100.0000 mg | ORAL_CAPSULE | Freq: Two times a day (BID) | ORAL | Status: DC
  Administered 2016-08-07 – 2016-08-08 (×2): 100 mg via ORAL
  Filled 2016-08-07 (×2): qty 1

## 2016-08-07 MED ORDER — CEFAZOLIN SODIUM-DEXTROSE 2-4 GM/100ML-% IV SOLN
2.0000 g | INTRAVENOUS | Status: AC
  Administered 2016-08-07: 2 g via INTRAVENOUS
  Filled 2016-08-07: qty 100

## 2016-08-07 SURGICAL SUPPLY — 44 items
BENZOIN TINCTURE PRP APPL 2/3 (GAUZE/BANDAGES/DRESSINGS) ×2 IMPLANT
BINDER ABDOMINAL 12 ML 46-62 (SOFTGOODS) ×2 IMPLANT
BIOPATCH WHT 1IN DISK W/4.0 H (GAUZE/BANDAGES/DRESSINGS) ×2 IMPLANT
COVER SURGICAL LIGHT HANDLE (MISCELLANEOUS) ×2 IMPLANT
DECANTER SPIKE VIAL GLASS SM (MISCELLANEOUS) ×2 IMPLANT
DEVICE TROCAR PUNCTURE CLOSURE (ENDOMECHANICALS) ×4 IMPLANT
DRAIN CHANNEL 19F RND (DRAIN) ×2 IMPLANT
DRAPE LAPAROSCOPIC ABDOMINAL (DRAPES) ×2 IMPLANT
DRSG OPSITE POSTOP 4X10 (GAUZE/BANDAGES/DRESSINGS) ×2 IMPLANT
ELECT REM PT RETURN 9FT ADLT (ELECTROSURGICAL) ×2
ELECTRODE REM PT RTRN 9FT ADLT (ELECTROSURGICAL) ×1 IMPLANT
EVACUATOR SILICONE 100CC (DRAIN) ×2 IMPLANT
GAUZE SPONGE 4X4 12PLY STRL (GAUZE/BANDAGES/DRESSINGS) IMPLANT
GLOVE BIO SURGEON STRL SZ7 (GLOVE) ×2 IMPLANT
GLOVE BIO SURGEON STRL SZ7.5 (GLOVE) ×2 IMPLANT
GLOVE INDICATOR 8.0 STRL GRN (GLOVE) ×2 IMPLANT
GOWN STRL REUS W/TWL XL LVL3 (GOWN DISPOSABLE) ×4 IMPLANT
KIT BASIN OR (CUSTOM PROCEDURE TRAY) ×2 IMPLANT
MARKER SKIN DUAL TIP RULER LAB (MISCELLANEOUS) ×2 IMPLANT
MESH ULTRAPRO 3X6 7.6X15CM (Mesh General) ×2 IMPLANT
NEEDLE HYPO 22GX1.5 SAFETY (NEEDLE) ×2 IMPLANT
NS IRRIG 1000ML POUR BTL (IV SOLUTION) ×2 IMPLANT
PACK GENERAL/GYN (CUSTOM PROCEDURE TRAY) ×2 IMPLANT
PAD TELFA 2X3 NADH STRL (GAUZE/BANDAGES/DRESSINGS) ×2 IMPLANT
SPONGE DRAIN TRACH 4X4 STRL 2S (GAUZE/BANDAGES/DRESSINGS) ×2 IMPLANT
SPONGE LAP 18X18 X RAY DECT (DISPOSABLE) IMPLANT
STAPLER VISISTAT 35W (STAPLE) ×2 IMPLANT
STRIP CLOSURE SKIN 1/2X4 (GAUZE/BANDAGES/DRESSINGS) ×2 IMPLANT
SUT ETHILON 2 0 PS N (SUTURE) ×2 IMPLANT
SUT MNCRL AB 4-0 PS2 18 (SUTURE) ×2 IMPLANT
SUT NOVA 0 T19/GS 22DT (SUTURE) IMPLANT
SUT NOVA 1 T20/GS 25DT (SUTURE) IMPLANT
SUT NOVA NAB DX-16 0-1 5-0 T12 (SUTURE) ×6 IMPLANT
SUT PDS AB 1 CT1 27 (SUTURE) ×4 IMPLANT
SUT PDS AB 1 TP1 96 (SUTURE) ×4 IMPLANT
SUT PROLENE 0 CT 1 CR/8 (SUTURE) IMPLANT
SUT VIC AB 2-0 SH 27 (SUTURE) ×1
SUT VIC AB 2-0 SH 27X BRD (SUTURE) ×1 IMPLANT
SUT VIC AB 3-0 SH 27 (SUTURE) ×1
SUT VIC AB 3-0 SH 27X BRD (SUTURE) ×1 IMPLANT
SYR CONTROL 10ML LL (SYRINGE) ×2 IMPLANT
TOWEL OR 17X26 10 PK STRL BLUE (TOWEL DISPOSABLE) ×2 IMPLANT
TOWEL OR NON WOVEN STRL DISP B (DISPOSABLE) ×2 IMPLANT
TRAY FOLEY W/METER SILVER 16FR (SET/KITS/TRAYS/PACK) ×2 IMPLANT

## 2016-08-07 NOTE — Transfer of Care (Signed)
Immediate Anesthesia Transfer of Care Note  Patient: Willie Carney  Procedure(s) Performed: Procedure(s): OPEN REPAIR  OF INCISIONAL HERNIA (N/A) INSERTION OF MESH (N/A)  Patient Location: PACU  Anesthesia Type:General  Level of Consciousness: awake, alert  and oriented  Airway & Oxygen Therapy: Patient Spontanous Breathing and Patient connected to face mask oxygen  Post-op Assessment: Report given to RN and Post -op Vital signs reviewed and stable  Post vital signs: Reviewed and stable  Last Vitals:  Vitals:   08/07/16 0554  BP: 103/84  Pulse: (!) 59  Resp: 18  Temp: 36.7 C    Last Pain:  Vitals:   08/07/16 0554  TempSrc: Oral      Patients Stated Pain Goal: 4 (08/07/16 0615)  Complications: No apparent anesthesia complications

## 2016-08-07 NOTE — Anesthesia Procedure Notes (Signed)
Procedure Name: Intubation Date/Time: 08/07/2016 7:50 AM Performed by: Enriqueta ShutterWILLIFORD, Lavan Imes D Pre-anesthesia Checklist: Patient identified, Emergency Drugs available, Suction available and Patient being monitored Patient Re-evaluated:Patient Re-evaluated prior to inductionOxygen Delivery Method: Circle system utilized Preoxygenation: Pre-oxygenation with 100% oxygen Intubation Type: IV induction Ventilation: Mask ventilation without difficulty Laryngoscope Size: Mac and 4 Grade View: Grade II Tube type: Oral Tube size: 7.5 mm Number of attempts: 1 Airway Equipment and Method: Stylet Placement Confirmation: ETT inserted through vocal cords under direct vision,  positive ETCO2 and breath sounds checked- equal and bilateral Secured at: 22 cm Tube secured with: Tape Dental Injury: Teeth and Oropharynx as per pre-operative assessment

## 2016-08-07 NOTE — Anesthesia Postprocedure Evaluation (Signed)
Anesthesia Post Note  Patient: Willie Carney  Procedure(s) Performed: Procedure(s) (LRB): OPEN REPAIR  OF INCISIONAL HERNIA (N/A) INSERTION OF MESH (N/A)  Patient location during evaluation: PACU Anesthesia Type: MAC Level of consciousness: awake and alert Pain management: pain level controlled Vital Signs Assessment: post-procedure vital signs reviewed and stable Respiratory status: spontaneous breathing, nonlabored ventilation, respiratory function stable and patient connected to nasal cannula oxygen Cardiovascular status: stable and blood pressure returned to baseline Anesthetic complications: no       Last Vitals:  Vitals:   08/07/16 1226 08/07/16 1330  BP: (!) 158/100 (!) 151/87  Pulse: 81 67  Resp: 16 18  Temp: 36.8 C 36.8 C    Last Pain:  Vitals:   08/07/16 1330  TempSrc: Oral  PainSc:                  Merina Behrendt S

## 2016-08-07 NOTE — Anesthesia Preprocedure Evaluation (Signed)
Anesthesia Evaluation  Patient identified by MRN, date of birth, ID band Patient awake    Reviewed: Allergy & Precautions, NPO status , Patient's Chart, lab work & pertinent test results  Airway Mallampati: II  TM Distance: >3 FB Neck ROM: Full    Dental no notable dental hx.    Pulmonary neg pulmonary ROS,    Pulmonary exam normal breath sounds clear to auscultation       Cardiovascular negative cardio ROS Normal cardiovascular exam Rhythm:Regular Rate:Normal     Neuro/Psych negative neurological ROS  negative psych ROS   GI/Hepatic negative GI ROS, Neg liver ROS,   Endo/Other  negative endocrine ROS  Renal/GU negative Renal ROS  negative genitourinary   Musculoskeletal negative musculoskeletal ROS (+)   Abdominal   Peds negative pediatric ROS (+)  Hematology negative hematology ROS (+)   Anesthesia Other Findings   Reproductive/Obstetrics negative OB ROS                            Anesthesia Physical Anesthesia Plan  ASA: II  Anesthesia Plan: General   Post-op Pain Management:    Induction: Intravenous  Airway Management Planned: Oral ETT  Additional Equipment:   Intra-op Plan:   Post-operative Plan: Extubation in OR  Informed Consent: I have reviewed the patients History and Physical, chart, labs and discussed the procedure including the risks, benefits and alternatives for the proposed anesthesia with the patient or authorized representative who has indicated his/her understanding and acceptance.   Dental advisory given  Plan Discussed with: CRNA and Surgeon  Anesthesia Plan Comments:         Anesthesia Quick Evaluation  

## 2016-08-07 NOTE — Op Note (Signed)
Willie Carney, Willie Carney                   ACCOUNT NO.:  [id number]  MEDICAL RECORD NO.:  [id number]  LOCATION:                                 FACILITY:  PHYSICIAN:  Mary Sella. Andrey Campanile, MD, FACSDATE OF BIRTH:  01/18/1966  DATE OF PROCEDURE:  08/07/2016 DATE OF DISCHARGE:                              OPERATIVE REPORT   PREOPERATIVE DIAGNOSIS:  Incisional hernias.  POSTOPERATIVE DIAGNOSIS:  Incisional hernias.  PROCEDURES:  Open retrorectus repair of incisional hernias with mesh.  SURGEON:  Mary Sella. Andrey Campanile, MD, FACS.  ASSISTANCE SURGEON:  Ollen Gross. Vernell Morgans, M.D.  ANESTHESIA:  General.  EBL:  50 mL.  SPECIMEN:  None.  DRAINS:  A 19-French round drain in the retrorectus space.  INDICATIONS FOR PROCEDURE:  The patient is a pleasant 50 year old gentleman who had a bowel resection in the early 90s for what appeared to be Crohn's disease.  He stated that he underwent incisional hernia repair in 2000 at the [facility].  It was unclear whether or not mesh was used.  He developed a recurrent incisional hernia a few years ago and now presented with more discomfort in the upper midline.  He desired surgical intervention.  A preoperative CT demonstrated 2 incisional hernias, both in the upper midline, 1 just above the umbilicus, which was the larger one and one a little bit higher up in the epigastrium, both contained fat.  We discussed several surgical approaches.  After extensive discussion over two consults, the patient elected to undergo an open retrorectus repair.  We had an extensive discussion regarding risks and benefits of surgery as well as typical postoperative course. Please see my notes for further details.  DESCRIPTION OF PROCEDURE:  In the short-stay area, the patient received oral Tylenol, gabapentin, and Celebrex and was then taken to the operating room, placed supine on the OR table.  General endotracheal anesthesia was established.  Sequential compression devices were  placed. A Foley catheter was placed.  His abdomen was prepped and draped in the usual standard surgical fashion.  A surgical time-out was performed.  He received IV antibiotics prior to skin incision.  He had a midline incision starting an inch or 2 above the umbilicus extending inferior toward the pubis.  I elected to make an incision in the epigastrium extending slightly below the level of the umbilicus.  I did incorporate part of his old scar.  Subcutaneous tissue was divided and we got down to the fascia, entered the fascia in the upper abdomen where there was no prior scar.  Previous permanent suture was encountered and removed sequentially.  The abdominal cavity was entered.  There were some omental adhesions to the anterior abdominal wall which were taken down with the Bovie electrocautery.  We extended our fascial incision inferiorly down toward the umbilicus.  There was a plug of omentum just above the umbilicus and through the fascial defect.  This was reduced. We then took down the remaining adhesions to the anterior abdominal wall on the right side of the abdomen.  I took down the falciform ligament and tied it off with a 2-0 silk suture.  There was a plug of  epigastric fat consistent with an epigastric hernia in this location and all the fat was reduced from subcutaneous tissue.  At this point, I incised the posterior fascia about 1 cm from the fascial edge with Bovie electrocautery and got into the retrorectus space, which was essentially avascular.  The dissection then continued out laterally toward the patient's left side, until I encountered perforating vessels and stopped. This was done along the entire length of the left side of the incision, but it was carried superiorly and inferiorly.  I then switched sides and made a similar incision about 1 cm from the edge of the rectus muscle into the posterior fascia, got into the avascular space and carried the dissection out  laterally to the perforating vessels gently with blunt dissection and with some occasional electrocautery for thin tiny vessels.  I connected both the inferior and inferior aspects of the posterior flap.  At this point, I started reapproximating the posterior fascia with a running #1 PDS, 1 from above, 1 from below.  We were able to reapproximate the posterior fascia without any untoward tension.  The retrorectus space was then measured.  It measured approximately 6 inches long by about 3 inches wide.  I obtained a piece of Ethicon Ultrapro mesh, which was 3 inches x 6 inches long.  The mesh was laid into the retrorectus space.  There was no redundancy.  We then sutured the mesh to the posterior fascia at the apex and then brought out the tails of the suture through the skin as a transfascial suture using the Endo Close suture passer.  Three sutures of interrupted Novafil were placed along the left side of the mesh, anchoring it to the posterior fascia, which was a small bite and then brought out in a transfascial suture configuration on the left side.  We then did 3 sutures on the right side of the abdomen in a similar fashion with again Novafil suture.  I then placed an inferior anchoring suture in the midline of the mesh and brought it out through the lower midline as well.  The sutures were lifted up and there was no redundancy in the mesh.  We then tied down all the 8 transfascial sutures.  There was 1 section on the left lower edge of the mesh where it was curling inward a little bit, so I placed 1 additional transfascial suture to one curl that small section of the mesh.  Now laid flat nicely.  At this point, the retrorectus space was irrigated with antibiotic irrigation.  A drain was placed through the skin into the retrorectus space and secured to the skin with a 2-0 nylon.  Exparel was then infiltrated in the muscle in a circumferential fashion with multiple injection sites.  The  anterior fascia was then reapproximated with a #1 looped PDS x2.  The subcutaneous tissue was irrigated with remaining antibiotic irrigation and the skin was closed with skin staples.  Some Telfa wicks were placed between some of the staples.  Benzoin, Steri-Strips, and Band-Aids were placed on the 9 transfascial sites and a drain sponge was placed around the surgical drain followed by a honeycomb dressing over the midline.  An abdominal binder was then placed.  The patient was extubated and brought to the recovery room in stable condition.  All needle, instrument, sponge counts were correct x2.  There were no immediate complications.  The patient tolerated procedure well.     Mary Sella. Andrey Campanile, MD, FACS  EMW/MEDQ  D:  08/07/2016  T:  08/07/2016  Job:  [id number]

## 2016-08-07 NOTE — Progress Notes (Signed)
Dr. Okey Dupre made aware of patient's blood pressures and heart rates- O.K. To go to floor

## 2016-08-07 NOTE — Interval H&P Note (Signed)
History and Physical Interval Note:  08/07/2016 7:21 AM  Willie Carney  has presented today for surgery, with the diagnosis of incisional hernia  The various methods of treatment have been discussed with the patient and family. After consideration of risks, benefits and other options for treatment, the patient has consented to  Procedure(s): OPEN REPAIR  OF INCISIONAL HERNIA (N/A) INSERTION OF MESH (N/A) as a surgical intervention .  The patient's history has been reviewed, patient examined, no change in status, stable for surgery.  I have reviewed the patient's chart and labs.  Questions were answered to the patient's satisfaction.  Mary Sella. Andrey Campanile, MD, FACS General, Bariatric, & Minimally Invasive Surgery [facility], [place]      [facility] M

## 2016-08-07 NOTE — Brief Op Note (Signed)
08/07/2016  10:10 AM  PATIENT:  Willie Carney  49 y.o. male  PRE-OPERATIVE DIAGNOSIS:  incisional hernia  POST-OPERATIVE DIAGNOSIS:  incisional hernia  PROCEDURE:  Procedure(s): OPEN RETRORECTUS REPAIR  OF INCISIONAL HERNIA with Mesh (N/A) INSERTION OF MESH (N/A)  SURGEON:  Surgeon(s) and Role:    * Gaynelle Adu, MD - Primary    * Chevis Pretty III, MD - Assisting  PHYSICIAN ASSISTANT:   ASSISTANTS: see above   ANESTHESIA:   general  EBL:  Total I/O In: 1000 [I.V.:1000] Out: 300 [Urine:250; Blood:50]  BLOOD ADMINISTERED:none  DRAINS: (19) Jackson-Pratt drain(s) with closed bulb suction in the retrorectus space   LOCAL MEDICATIONS USED:  OTHER exparel  Type of repair - retro-rectus - posterior component separation with fascial closure with mesh  (choices - primary suture, mesh, or component)  Name of mesh - ethicon ultra pro  Size of mesh - Length 6 inches, Width 3 inches  Mesh overlap - >5 cm  Placement of mesh - retro-rectus space  (choices - beneath fascia and into peritoneal cavity, beneath fascia but external to peritoneal cavity, between the muscle and fascia, above or external to fascia)   SPECIMEN:  No Specimen  DISPOSITION OF SPECIMEN:  N/A  COUNTS:  YES  TOURNIQUET:  * No tourniquets in log *  DICTATION: .Other Dictation: Dictation Number [id number]  PLAN OF CARE: Admit for overnight observation  PATIENT DISPOSITION:  PACU - hemodynamically stable.   Delay start of Pharmacological VTE agent (>24hrs) due to surgical blood loss or risk of bleeding: no  Mary Sella. Andrey Campanile, MD, FACS General, Bariatric, & Minimally Invasive Surgery [facility], [place]

## 2016-08-08 DIAGNOSIS — K5090 Crohn's disease, unspecified, without complications: Secondary | ICD-10-CM | POA: Diagnosis not present

## 2016-08-08 DIAGNOSIS — K432 Incisional hernia without obstruction or gangrene: Secondary | ICD-10-CM | POA: Diagnosis not present

## 2016-08-08 DIAGNOSIS — F419 Anxiety disorder, unspecified: Secondary | ICD-10-CM | POA: Diagnosis not present

## 2016-08-08 DIAGNOSIS — Z888 Allergy status to other drugs, medicaments and biological substances status: Secondary | ICD-10-CM | POA: Diagnosis not present

## 2016-08-08 DIAGNOSIS — F329 Major depressive disorder, single episode, unspecified: Secondary | ICD-10-CM | POA: Diagnosis not present

## 2016-08-08 LAB — BASIC METABOLIC PANEL
ANION GAP: 6 (ref 5–15)
BUN: 11 mg/dL (ref 6–20)
CALCIUM: 8.5 mg/dL — AB (ref 8.9–10.3)
CHLORIDE: 105 mmol/L (ref 101–111)
CO2: 24 mmol/L (ref 22–32)
Creatinine, Ser: 0.99 mg/dL (ref 0.61–1.24)
GFR calc Af Amer: >60 mL/min (ref >60)
GFR calc non Af Amer: >60 mL/min (ref >60)
GLUCOSE: 128 mg/dL — AB (ref 65–99)
Potassium: 4.8 mmol/L (ref 3.5–5.1)
SODIUM: 135 mmol/L (ref 135–145)

## 2016-08-08 LAB — CBC
HCT: 39.2 % (ref 39.0–52.0)
Hemoglobin: 13.4 g/dL (ref 13.0–17.0)
MCH: 30.8 pg (ref 26.0–34.0)
MCHC: 34.2 g/dL (ref 30.0–36.0)
MCV: 90.1 fL (ref 78.0–100.0)
PLATELETS: 163 10*3/uL (ref 150–400)
RBC: 4.35 MIL/uL (ref 4.22–5.81)
RDW: 12.1 % (ref 11.5–15.5)
WBC: 10.7 10*3/uL — ABNORMAL HIGH (ref 4.0–10.5)

## 2016-08-08 MED ORDER — METHOCARBAMOL 500 MG PO TABS: 500.0000 mg | ORAL_TABLET | Freq: Four times a day (QID) | ORAL | 0 refills | Status: AC | PRN

## 2016-08-08 MED ORDER — OXYCODONE HCL 5 MG PO TABS: 5.0000 mg | ORAL_TABLET | ORAL | 0 refills | Status: AC | PRN

## 2016-08-08 NOTE — Discharge Summary (Signed)
Physician Discharge Summary  Willie Carney ELF:[id number] DOB: [birth year] DOA: 08/07/2016  PCP: Donnajean Lopes, MD  Admit date: 08/07/2016 Discharge date: 08/08/2016  Recommendations for Outpatient Follow-up:  1.   Follow-up Information    [facility]. Schedule an appointment as soon as possible for a visit in 5 day(s).   Specialty:  General Surgery Why:  nurse only visit for drain removal Contact information: [address] [place] [address] [phone]        Gayland Curry, MD. Schedule an appointment as soon as possible for a visit in 2 week(s).   Specialty:  General Surgery Contact information: [address] [place] [address] [phone]          Discharge Diagnoses:  1. Recurrent incisional hernia  Surgical Procedure: open retrorectus repair of incisional hernia with mesh  Discharge Condition: good Disposition: home  Diet recommendation: regular  Filed Weights   08/07/16 0615  Weight: 96.2 kg (212 lb)    History of present illness:  The patient is a 50 year old male who presents with an incisional hernia. He comes in today for preoperative appointment. I initially met him in the summer for an incisional hernia. He denies any significant medical changes since he was last seen. However he states he did undergo a colonoscopy and there is no evidence of inflammatory bowel disease per the gastroenterologist. He denies any nausea, vomiting, diarrhea or constipation. He reports good energy level. He denies any chest pain, chest pressure, shortness of breath, dyspnea on exertion. He underwent a CT scan since his last visit and we discussed the results on the phone a few months ago. He has 2 incisional hernias. The more superior one is about 1 1/2 cm wide and underwent a bit more inferior which is in the supraumbilical position is 4.00 cm wide. There is a small piece of intact fascia between the 2. The total  height when combining both defects was around 8.7 cm. It only contained fat.  Hospital Course:  He was brought in for a planned incisional hernia repair. Preoperatively he was given oral Tylenol and Neurontin and Celebrex. Please see operative note for further details. He was kept overnight for observation as well as for pain control. On postoperative day one he had voided without difficulty. He had ambulated 3 times in the hallway. His pain was controlled with oral medications. He was having flatus. He was tolerating a liquid diet. He had no nausea or vomiting. He had been instructed on drain care. His drain output was serosanguineous. His hemoglobin was stable. Although nursing staff documented 285 mL out of his drain at 5 AM I think that was recorded in the wrong column. His hemoglobin was stable and he was not tachycardic It was felt he was safe for discharge.   BP 131/84 (BP Location: Right Arm)   Pulse (!) 56   Temp 98.3 F (36.8 C) (Oral)   Resp 16   Ht 5' 6" (1.676 m)   Wt 96.2 kg (212 lb)   SpO2 100%   BMI 34.22 kg/m   Gen: alert, NAD, non-toxic appearing Pupils: equal, no scleral icterus Pulm: Lungs clear to auscultation, symmetric chest rise CV: regular rate and rhythm Abd: soft, approp mild tender, nondistended. Dressings c/d/i; JP - serosang No cellulitis. No incisional hernia Ext: no edema, no calf tenderness Skin: no rash, no jaundice    Discharge Instructions   Allergies as of 08/08/2016  Reactions   Prednisone Rash      Medication List    TAKE these medications   diphenhydramine-acetaminophen 25-500 MG Tabs tablet Commonly known as:  TYLENOL PM Take 1-2 tablets by mouth at bedtime as needed (for sleep/pain.).   FIBER PO Take 1 tablet by mouth daily.   MELOXICAM PO Take 1 tablet by mouth daily as needed (for hip pain.).   methocarbamol 500 MG tablet Commonly known as:  ROBAXIN Take 1 tablet (500 mg total) by mouth every 6 (six) hours as needed for  muscle spasms.   multivitamin with minerals Tabs tablet Take 1 tablet by mouth daily.   oxyCODONE 5 MG immediate release tablet Commonly known as:  Oxy IR/ROXICODONE Take 1-2 tablets (5-10 mg total) by mouth every 4 (four) hours as needed for moderate pain.   tetrahydrozoline 0.05 % ophthalmic solution Place 1-2 drops into both eyes 4 (four) times daily as needed (for dry/irritated eyes).   VITAMIN B-12 SL Place 1 tablet under the tongue daily.      Follow-up Information    [facility]. Schedule an appointment as soon as possible for a visit in 5 day(s).   Specialty:  General Surgery Why:  nurse only visit for drain removal Contact information: [address] [place] [address] [phone]        Gayland Curry, MD. Schedule an appointment as soon as possible for a visit in 2 week(s).   Specialty:  General Surgery Contact information: [place] [address] [phone]            The results of significant diagnostics from this hospitalization (including imaging, microbiology, ancillary and laboratory) are listed below for reference.    Significant Diagnostic Studies: No results found.  Microbiology: No results found for this or any previous visit (from the past 240 hour(s)).   Labs: Basic Metabolic Panel:  Recent Labs Lab 08/05/16 1341 08/08/16 0436  NA 139 135  K 3.9 4.8  CL 108 105  CO2 25 24  GLUCOSE 91 128*  BUN 15 11  CREATININE 0.86 0.99  CALCIUM 8.6* 8.5*   Liver Function Tests:  Recent Labs Lab 08/05/16 1341  AST 24  ALT 24  ALKPHOS 46  BILITOT 0.9  PROT 6.9  ALBUMIN 4.0   No results for input(s): LIPASE, AMYLASE in the last 168 hours. No results for input(s): AMMONIA in the last 168 hours. CBC:  Recent Labs Lab 08/05/16 1341 08/08/16 0436  WBC 5.6 10.7*  NEUTROABS 2.7  --   HGB 13.8 13.4  HCT 39.2 39.2  MCV 88.5 90.1  PLT 167 163   Cardiac Enzymes: No results for  input(s): CKTOTAL, CKMB, CKMBINDEX, TROPONINI in the last 168 hours. BNP: BNP (last 3 results) No results for input(s): BNP in the last 8760 hours.  ProBNP (last 3 results) No results for input(s): PROBNP in the last 8760 hours.  CBG: No results for input(s): GLUCAP in the last 168 hours.  Active Problems:   Incisional hernia   Time coordinating discharge: 15 min  Signed:  Gayland Curry, MD [facility], [place] [phone] 08/08/2016, 9:43 AM

## 2016-08-08 NOTE — Discharge Instructions (Signed)
[facility]      [facility], [place] [phone]  OPEN ABDOMINAL SURGERY: POST OP INSTRUCTIONS  Always review your discharge instruction sheet given to you by the facility where your surgery was performed.  IF YOU HAVE DISABILITY OR FAMILY LEAVE FORMS, YOU MUST BRING THEM TO THE OFFICE FOR PROCESSING.  PLEASE DO NOT GIVE THEM TO YOUR DOCTOR.  1. A prescription for pain medication may be given to you upon discharge.  Take your pain medication as prescribed, if needed.  If narcotic pain medicine is not needed, then you may take acetaminophen (Tylenol) &/or ibuprofen (Advil) as needed. 2. Take your usually prescribed medications unless otherwise directed. 3. If you need a refill on your pain medication, please contact your pharmacy. They will contact our office to request authorization.  Prescriptions will not be filled after 5pm or on week-ends. 4. You should follow a light diet the first few days after arrival home, such as soup and crackers, pudding, etc.unless your doctor has advised otherwise. A high-fiber, low fat diet can be resumed as tolerated.   Be sure to include lots of fluids daily. Most patients will experience some swelling and bruising on the chest and neck area.  Ice packs will help.  Swelling and bruising can take several days to resolve 5. Most patients will experience some swelling and bruising in the area of the incision. Ice pack will help. Swelling and bruising can take several days to resolve..  6. It is common to experience some constipation if taking pain medication after surgery.  Increasing fluid intake and taking a stool softener will usually help or prevent this problem from occurring.  A mild laxative (Milk of Magnesia or Miralax) should be taken according to package directions if there are no bowel movements after 48 hours. 7.  You may have steri-strips (small skin tapes) in place directly over the incision.  These strips should be left on the skin for 7-10 days.  If  your surgeon used skin glue on the incision, you may shower in 24 hours.  The glue will flake off over the next 2-3 weeks.  Any sutures or staples will be removed at the office during your follow-up visit. You may find that a light gauze bandage over your incision may keep your staples from being rubbed or pulled. You may shower and replace the bandage daily. 8. ACTIVITIES:  You may resume regular (light) daily activities beginning the next day--such as daily self-care, walking, climbing stairs--gradually increasing activities as tolerated.  You may have sexual intercourse when it is comfortable.  Refrain from any heavy lifting or straining until approved by your doctor. a. You may drive when you no longer are taking prescription pain medication, you can comfortably wear a seatbelt, and you can safely maneuver your car and apply brakes b. Return to Work: ___________________________________ 9. You should see your doctor in the office for a follow-up appointment approximately two weeks after your surgery.  Make sure that you call for this appointment within a day or two after you arrive home to insure a convenient appointment time. OTHER INSTRUCTIONS:  WEAR ABDOMINAL BINDER FOR 30 DAYS  WHEN TO CALL YOUR DOCTOR: 1. Fever over 101.0 2. Inability to urinate 3. Nausea and/or vomiting 4. Extreme swelling or bruising 5. Continued bleeding from incision. 6. Increased pain, redness, or drainage from the incision. 7. Difficulty swallowing or breathing 8. Muscle cramping or spasms. 9. Numbness or tingling in hands or feet or around lips.  The clinic  staff is available to answer your questions during regular business hours.  Please dont hesitate to call and ask to speak to one of the nurses if you have concerns.  For further questions, please visit [email or web address]  [facility] Introduction Surgical drains are used to remove extra fluid that normally builds up in a surgical  wound after surgery. A surgical drain helps to heal a surgical wound. Different kinds of surgical drains include:  Active drains. These drains use suction to pull drainage away from the surgical wound. Drainage flows through a tube to a container outside of the body. It is important to keep the bulb or the drainage container flat (compressed) at all times, except while you empty it. Flattening the bulb or container creates suction. The two most common types of active drains are bulb drains and Hemovac drains.  Passive drains. These drains allow fluid to drain naturally, by gravity. Drainage flows through a tube to a bandage (dressing) or a container outside of the body. Passive drains do not need to be emptied. The most common type of passive drain is the Penrose drain. A drain is placed during surgery. Immediately after surgery, drainage is usually bright red and a little thicker than water. The drainage may gradually turn yellow or pink and become thinner. It is likely that your health care provider will remove the drain when the drainage stops or when the amount decreases to 1-2 Tbsp (15-30 mL) during a 24-hour period. How to care for your surgical drain  Keep the skin around the drain dry and covered with a dressing at all times.  Check your drain area every day for signs of infection. Check for:  More redness, swelling, or pain.  Pus or a bad smell.  Cloudy drainage. Follow instructions from your health care provider about how to take care of your drain and how to change your dressing. Change your dressing at least one time every day. Change it more often if needed to keep the dressing dry. Make sure you: 1. Gather your supplies, including:  Tape.  Germ-free cleaning solution (sterile saline).  Split gauze drain sponge: 4 x 4 inches (10 x 10 cm).  Gauze square: 4 x 4 inches (10 x 10 cm). 2. Wash your hands with soap and water before you change your dressing. If soap and water are not  available, use hand sanitizer. 3. Remove the old dressing. Avoid using scissors to do that. 4. Use sterile saline to clean your skin around the drain. 5. Place the tube through the slit in a drain sponge. Place the drain sponge so that it covers your wound. 6. Place the gauze square or another drain sponge on top of the drain sponge that is on the wound. Make sure the tube is between those layers. 7. Tape the dressing to your skin. 8. If you have an active bulb or Hemovac drain, tape the drainage tube to your skin 1-2 inches (2.5-5 cm) below the place where the tube enters your body. Taping keeps the tube from pulling on any stitches (sutures) that you have. 9. Wash your hands with soap and water. 10. Write down the color of your drainage and how often you change your dressing. How to empty your active bulb or Hemovac drain 1. Make sure that you have a measuring cup that you can empty your drainage into. 2. Wash your hands with soap and water. If soap and water are not available, use hand sanitizer. 3. Gently move  your fingers down the tube while squeezing very lightly. This is called stripping the tube. This clears any drainage, clots, or tissue from the tube.  Do not pull on the tube.  You may need to strip the tube several times every day to keep the tube clear. 4. Open the bulb cap or the drain plug. Do not touch the inside of the cap or the bottom of the plug. 5. Empty all of the drainage into the measuring cup. 6. Compress the bulb or the container and replace the cap or the plug. To compress the bulb or the container, squeeze it firmly in the middle while you close the cap or plug the container. 7. Write down the amount of drainage that you have in each 24-hour period. If you have less than 2 Tbsp (30 mL) of drainage during 24 hours, contact your health care provider. 8. Flush the drainage down the toilet. 9. Wash your hands with soap and water. Contact a health care provider if:  You  have more redness, swelling, or pain around your drain area.  The amount of drainage that you have is increasing instead of decreasing.  You have pus or a bad smell coming from your drain area.  You have a fever.  You have drainage that is cloudy.  There is a sudden stop or a sudden decrease in the amount of drainage that you have.  Your tube falls out.  Your active draindoes not stay compressedafter you empty it. This information is not intended to replace advice given to you by your health care provider. Make sure you discuss any questions you have with your health care provider. Document Released: 08/01/2000 Document Revised: 01/10/2016 Document Reviewed: 02/21/2015  2017 [employer]vier

## 2016-08-08 NOTE — Progress Notes (Signed)
Pt's vitals are WNL, tolerating diet and pain is under control. Discussed discharge instructions with patient. All questions and concerns were addressed. Discharged to home with prescriptions.

## 2016-08-08 NOTE — Progress Notes (Signed)
Pt able to empty drain. Supplies given . Instructed to keep a record of emptied amounts--chart given. Voices understanding

## 2017-01-07 DIAGNOSIS — R05 Cough: Secondary | ICD-10-CM | POA: Diagnosis not present

## 2017-01-07 DIAGNOSIS — R197 Diarrhea, unspecified: Secondary | ICD-10-CM | POA: Diagnosis not present

## 2017-01-07 DIAGNOSIS — R509 Fever, unspecified: Secondary | ICD-10-CM | POA: Diagnosis not present

## 2017-01-07 DIAGNOSIS — N1330 Unspecified hydronephrosis: Secondary | ICD-10-CM | POA: Diagnosis not present

## 2017-01-07 DIAGNOSIS — N200 Calculus of kidney: Secondary | ICD-10-CM | POA: Diagnosis not present

## 2017-01-07 DIAGNOSIS — K5090 Crohn's disease, unspecified, without complications: Secondary | ICD-10-CM | POA: Diagnosis not present

## 2017-04-10 DIAGNOSIS — L300 Nummular dermatitis: Secondary | ICD-10-CM | POA: Diagnosis not present

## 2017-04-10 DIAGNOSIS — L720 Epidermal cyst: Secondary | ICD-10-CM | POA: Diagnosis not present

## 2017-04-16 IMAGING — CT CT ABD-PELV W/ CM
1 of 3 series · 14 of 32 positions shown, 19 images · IV contrast (APPLIED)
Comparison: None.

CLINICAL DATA: Incisional hernia.

EXAM:
CT ABDOMEN AND PELVIS WITH CONTRAST
TECHNIQUE: Multidetector CT imaging of the abdomen and pelvis was performed
using the standard protocol following bolus administration of
intravenous contrast.
CONTRAST:  100mL P8RQSZ-AXX IOPAMIDOL (P8RQSZ-AXX) INJECTION 61%

[Series 2: abd/pelvis w/cm · axial · 0.80mm/px · z∈[-446,+14]mm · 14 of 104 slices shown, 19 images]
[im 6/104  soft-tissue]
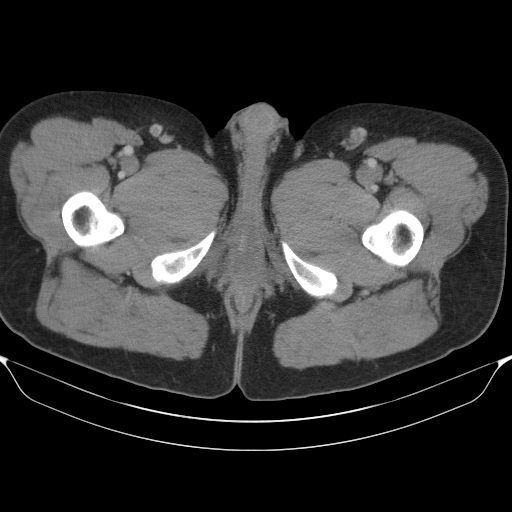
[im 6/104  bone]
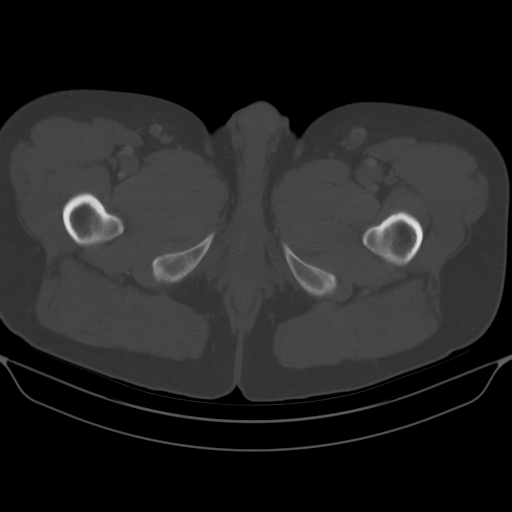
[im 17/104  soft-tissue]
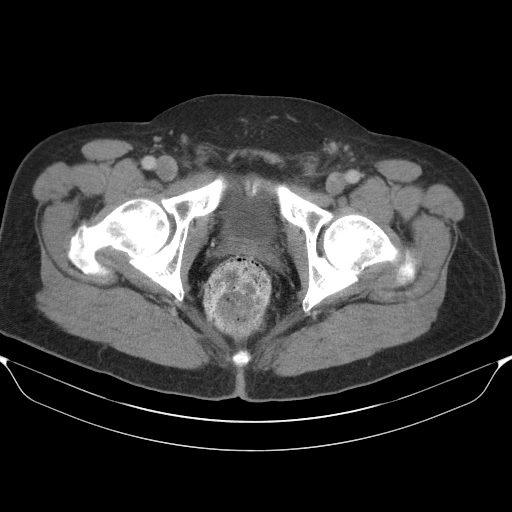
[im 22/104  soft-tissue]
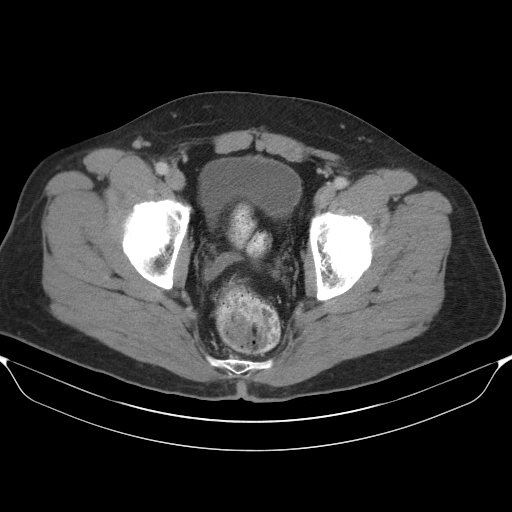
[im 28/104  soft-tissue]
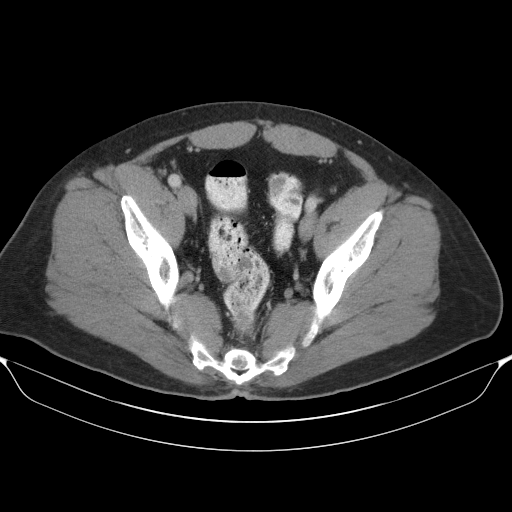
[im 38/104  soft-tissue]
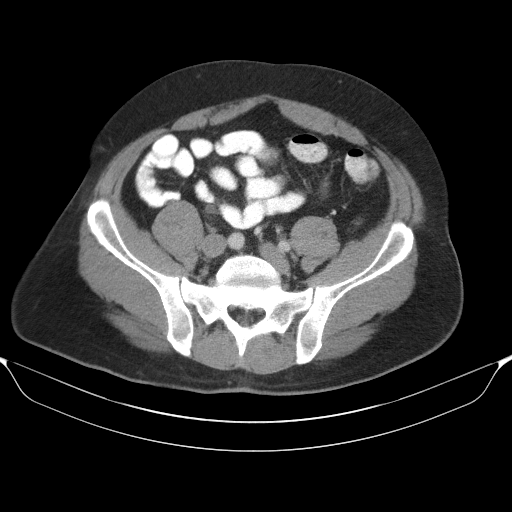
[im 44/104  soft-tissue]
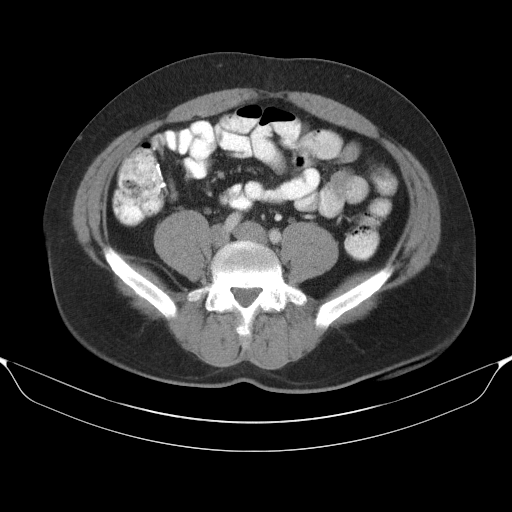
[im 55/104  soft-tissue]
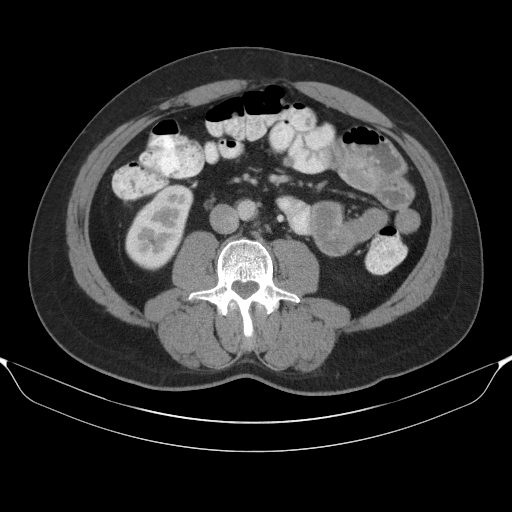
[im 60/104  soft-tissue]
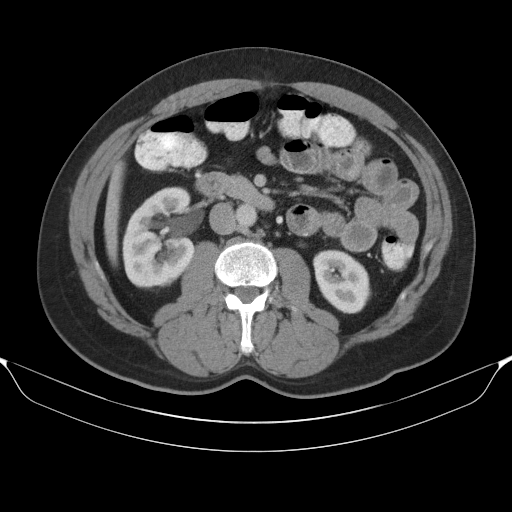
[im 66/104  soft-tissue]
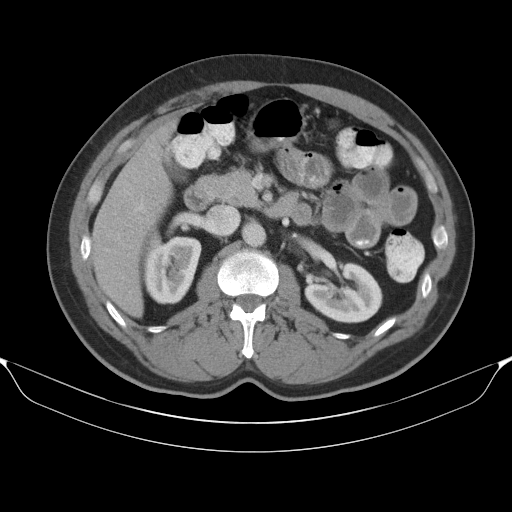
[im 66/104  bone]
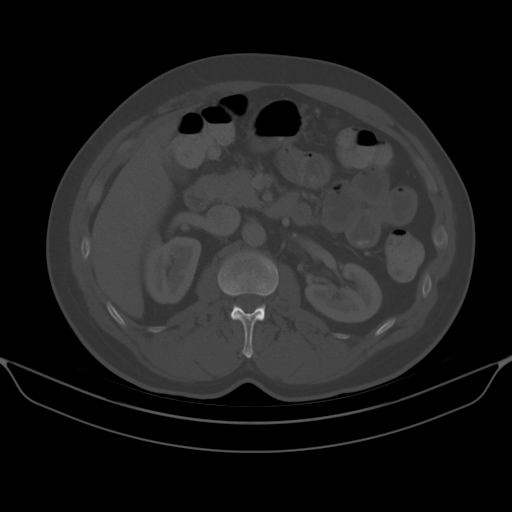
[im 76/104  soft-tissue]
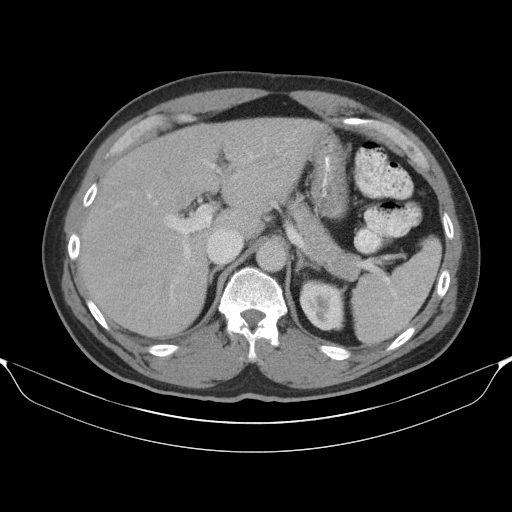
[im 82/104  soft-tissue]
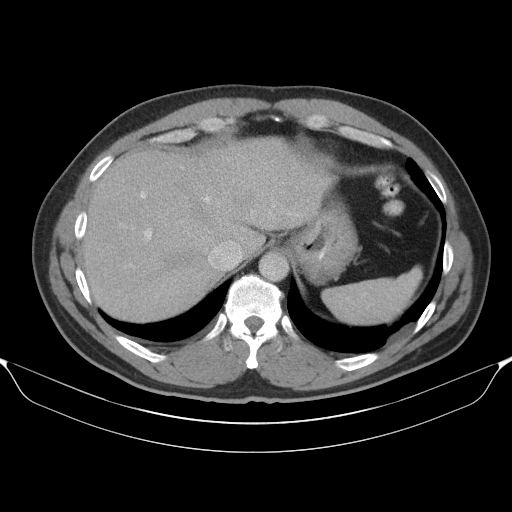
[im 82/104  lung]
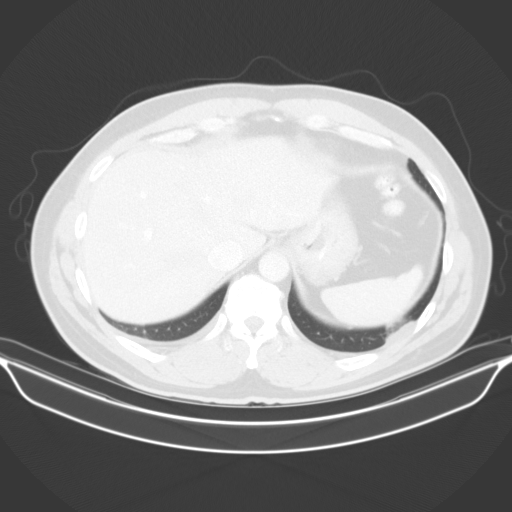
[im 87/104  soft-tissue]
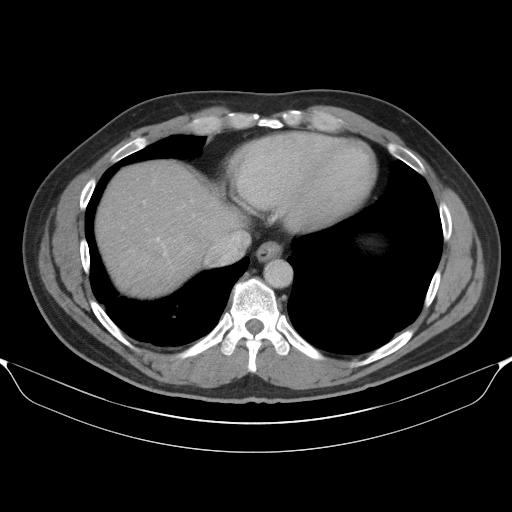
[im 87/104  lung]
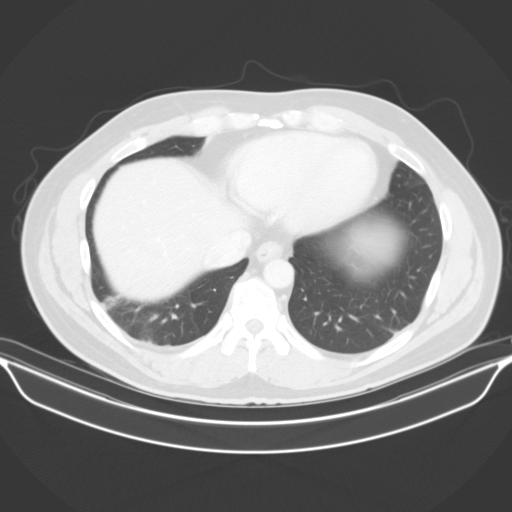
[im 93/104  lung]
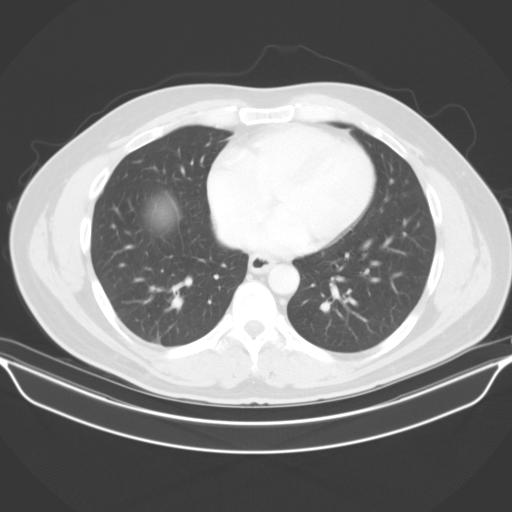
[im 98/104  soft-tissue]
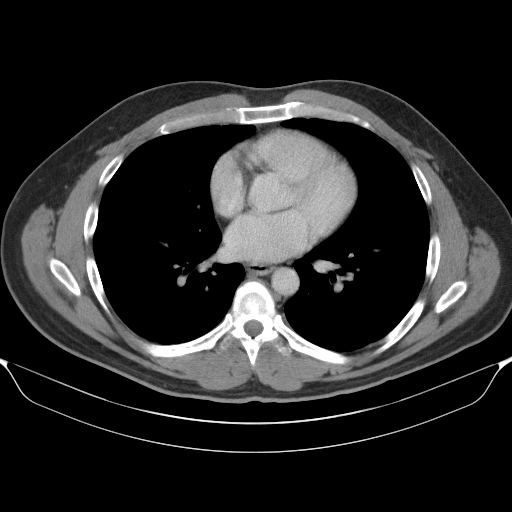
[im 98/104  lung]
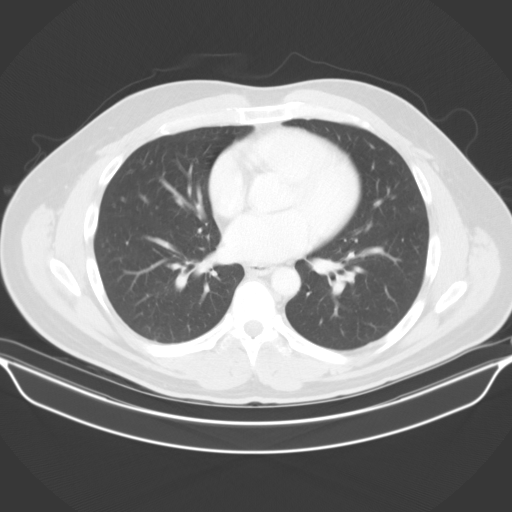

[14 of 32 positions shown; findings below may reference images not displayed]

FINDINGS: Musculoskeletal: There are 2 focal midline incisional hernias in the
Jarvis Jim. The more superior defect measures 11 mm in diameter with
a small amount of peritoneal fat protruding through the defect. This
is 5 cm above the umbilicus. There is thinning and irregularity of
the Jarvis Jim with just above the umbilicus with a least 2 small
areas of full-thickness defect with slight protrusion of peritoneal
fat just superior to the umbilicus. No herniated bowel.

Patient has focal arthritis of both hips, with cystic degenerative
changes of the anterior aspects of both acetabuli, more on the left
than the right with joint space narrowing. The moderate left facet
arthritis at L5-S1.

Lower chest: There are tiny areas of loculated pleural fluid at both
lung bases posteriorly towards tiny area of scarring or linear
atelectasis at the right base posterior laterally. Heart appears
normal.

Hepatobiliary: Normal.

Pancreas: Normal.

Spleen: Normal.

Adrenals/Urinary Tract: Normal.

Stomach/Bowel: No significant abnormality. Previous ascending colon
surgery.

Vascular/Lymphatic: No pathologically enlarged lymph nodes. No
evidence of abdominal aortic aneurysm.

Reproductive: No mass or other significant abnormality.

Other: None.
IMPRESSION: There are several small defects in the Jarvis Jim just above the
umbilicus with protrusion of small portions of peritoneal fat
through the defects.

No other significant abnormalities.

## 2017-05-22 DIAGNOSIS — Z125 Encounter for screening for malignant neoplasm of prostate: Secondary | ICD-10-CM | POA: Diagnosis not present

## 2017-05-22 DIAGNOSIS — Z0000 Encounter for general adult medical examination without abnormal findings: Secondary | ICD-10-CM | POA: Diagnosis not present

## 2017-05-29 DIAGNOSIS — J181 Lobar pneumonia, unspecified organism: Secondary | ICD-10-CM | POA: Diagnosis not present

## 2017-05-29 DIAGNOSIS — N528 Other male erectile dysfunction: Secondary | ICD-10-CM | POA: Diagnosis not present

## 2017-05-29 DIAGNOSIS — K5090 Crohn's disease, unspecified, without complications: Secondary | ICD-10-CM | POA: Diagnosis not present

## 2017-05-29 DIAGNOSIS — Z0000 Encounter for general adult medical examination without abnormal findings: Secondary | ICD-10-CM | POA: Diagnosis not present

## 2017-05-29 DIAGNOSIS — M722 Plantar fascial fibromatosis: Secondary | ICD-10-CM | POA: Diagnosis not present

## 2017-05-29 DIAGNOSIS — Z1389 Encounter for screening for other disorder: Secondary | ICD-10-CM | POA: Diagnosis not present

## 2018-01-27 DIAGNOSIS — K219 Gastro-esophageal reflux disease without esophagitis: Secondary | ICD-10-CM | POA: Diagnosis not present

## 2018-01-27 DIAGNOSIS — Z6835 Body mass index (BMI) 35.0-35.9, adult: Secondary | ICD-10-CM | POA: Diagnosis not present

## 2018-01-27 DIAGNOSIS — K2970 Gastritis, unspecified, without bleeding: Secondary | ICD-10-CM | POA: Diagnosis not present

## 2018-01-27 DIAGNOSIS — E669 Obesity, unspecified: Secondary | ICD-10-CM | POA: Diagnosis not present

## 2018-03-02 DIAGNOSIS — Z6834 Body mass index (BMI) 34.0-34.9, adult: Secondary | ICD-10-CM | POA: Diagnosis not present

## 2018-03-02 DIAGNOSIS — W57XXXA Bitten or stung by nonvenomous insect and other nonvenomous arthropods, initial encounter: Secondary | ICD-10-CM | POA: Diagnosis not present

## 2018-03-02 DIAGNOSIS — K219 Gastro-esophageal reflux disease without esophagitis: Secondary | ICD-10-CM | POA: Diagnosis not present

## 2018-10-06 DIAGNOSIS — K080 Exfoliation of teeth due to systemic causes: Secondary | ICD-10-CM | POA: Diagnosis not present

## 2019-01-31 DIAGNOSIS — K080 Exfoliation of teeth due to systemic causes: Secondary | ICD-10-CM | POA: Diagnosis not present
